# Patient Record
Sex: Female | Born: 1980 | Race: Black or African American | Hispanic: No | Marital: Married | Smoking: Current some day smoker
Health system: Southern US, Community
[De-identification: ages and names within clinical notes are randomized; demographics above are authoritative.]

## PROBLEM LIST (undated history)

## (undated) DIAGNOSIS — I10 Essential (primary) hypertension: Secondary | ICD-10-CM

---

## 2012-11-03 ENCOUNTER — Emergency Department (HOSPITAL_COMMUNITY): Admission: EM | Admit: 2012-11-03 | Discharge: 2012-11-03 | Discharge status: Against Medical Advice | Payer: Self-pay

## 2012-11-05 ENCOUNTER — Encounter (HOSPITAL_COMMUNITY): Payer: Self-pay | Admitting: Emergency Medicine

## 2012-11-05 ENCOUNTER — Emergency Department (HOSPITAL_COMMUNITY)
Admission: EM | Admit: 2012-11-05 | Discharge: 2012-11-05 | Disposition: A | Discharge status: Home / Self Care | Payer: 59 | Attending: Emergency Medicine | Admitting: Emergency Medicine

## 2012-11-05 DIAGNOSIS — R209 Unspecified disturbances of skin sensation: Secondary | ICD-10-CM | POA: Insufficient documentation

## 2012-11-05 DIAGNOSIS — G510 Bell's palsy: Secondary | ICD-10-CM

## 2012-11-05 MED ORDER — PREDNISONE 10 MG PO TABS: 40.0000 mg | ORAL_TABLET | Freq: Every day | ORAL | Status: DC

## 2012-11-05 MED ORDER — VALACYCLOVIR HCL 1 G PO TABS: 1000.0000 mg | ORAL_TABLET | Freq: Three times a day (TID) | ORAL | Status: AC

## 2012-11-05 NOTE — ED Provider Notes (Signed)
History     CSN: [id number]  Arrival date & time 11/05/12  1044   First MD Initiated Contact with Patient 11/05/12 1051      Chief Complaint  Patient presents with  . "thinks she has bell's palsy"     (Consider location/radiation/quality/duration/timing/severity/associated sxs/prior treatment) HPI Sheri Maldonado is a 32 y.o. female who presents to ED with complaint of left sided numbness. States started two days ago. States she is having problems closing left eye, left mouth drooping, states food tastes different. Denies any injury. Denies headache. Denies taking any medications for this. States no fever, chills, malaise. No recent tick bites. States thinks she has had similar symptoms when was pregnant 12 years ago, but states she is not currently pregnant.    History reviewed. No pertinent past medical history.  History reviewed. No pertinent past surgical history.  History reviewed. No pertinent family history.  History  Substance Use Topics  . Smoking status: Never Smoker   . Smokeless tobacco: Not on file  . Alcohol Use: No    OB History   Grav Para Term Preterm Abortions TAB SAB Ect Mult Living                  Review of Systems  HENT: Negative for ear pain, facial swelling, neck pain, neck stiffness and tinnitus.   Eyes: Negative for pain and visual disturbance.  Neurological: Positive for facial asymmetry, weakness and numbness. Negative for headaches.  All other systems reviewed and are negative.    Allergies  Sulfa antibiotics  Home Medications  No current outpatient prescriptions on file.  LMP 10/29/2012  Physical Exam  Nursing note and vitals reviewed. Constitutional: She is oriented to person, place, and time. She appears well-developed and well-nourished. No distress.  HENT:  Head: Normocephalic.  Right Ear: External ear normal.  Left Ear: External ear normal.  Nose: Nose normal.  Mouth/Throat: Oropharynx is clear and moist.  Eyes:  Conjunctivae and EOM are normal. Pupils are equal, round, and reactive to light.  Neck: Neck supple.  Cardiovascular: Normal rate, regular rhythm and normal heart sounds.   Pulmonary/Chest: Effort normal and breath sounds normal. No respiratory distress. She has no wheezes. She has no rales.  Musculoskeletal: Normal range of motion.  Neurological: She is alert and oriented to person, place, and time.  Unable to raise left eyebrow, unable to close left eye tight, left mouth drooping. Tongue is symmetrical. Decreased sensation over left face. 5/5 and equal upper and lower extremity strength bilaterally. No pronator drift. Normal finger to nose. Visual fields intact. Gait is normal. Speech is normal.   Skin: Skin is warm and dry.    ED Course  Procedures (including critical care time)  Labs Reviewed - No data to display No results found.   1. Bell's palsy       MDM  PT with left facial weakness and decreased sensation, affecting forehead. She has no other neuro deficits. She has decreased taste. Pt has no risk factors for TIA or CVA. Suspect bell's palsy. Will start on prednisone and acyclovir. Follow up with pcp.           Lottie Mussel, PA-C 11/05/12 1524

## 2012-11-05 NOTE — ED Notes (Signed)
Pt unwilling to answer most questions during triage.  Pt very short with this Clinical research associate.  States "I think I have bell's palsy".  When asked why she thought that, pt states "because of the symptoms I am having!".  Pt's face is symmetrical with no droop.

## 2012-11-05 NOTE — ED Provider Notes (Signed)
Medical screening examination/treatment/procedure(s) were performed by non-physician practitioner and as supervising physician I was immediately available for consultation/collaboration.  Lyanne Co, MD 11/05/12 [phone]

## 2015-08-28 ENCOUNTER — Encounter (HOSPITAL_COMMUNITY): Payer: Self-pay

## 2015-08-28 ENCOUNTER — Emergency Department (HOSPITAL_COMMUNITY)
Admission: EM | Admit: 2015-08-28 | Discharge: 2015-08-28 | Disposition: A | Discharge status: Home / Self Care | Payer: BLUE CROSS/BLUE SHIELD | Attending: Emergency Medicine | Admitting: Emergency Medicine

## 2015-08-28 DIAGNOSIS — Z7952 Long term (current) use of systemic steroids: Secondary | ICD-10-CM | POA: Diagnosis not present

## 2015-08-28 DIAGNOSIS — I10 Essential (primary) hypertension: Secondary | ICD-10-CM | POA: Diagnosis present

## 2015-08-28 LAB — I-STAT CHEM 8, ED
BUN: 7 mg/dL (ref 6–20)
CALCIUM ION: 1.02 mmol/L — AB (ref 1.12–1.23)
CHLORIDE: 102 mmol/L (ref 101–111)
Creatinine, Ser: 0.6 mg/dL (ref 0.44–1.00)
Glucose, Bld: 115 mg/dL — ABNORMAL HIGH (ref 65–99)
HEMATOCRIT: 46 % (ref 36.0–46.0)
Hemoglobin: 15.6 g/dL — ABNORMAL HIGH (ref 12.0–15.0)
Potassium: 3.5 mmol/L (ref 3.5–5.1)
SODIUM: 142 mmol/L (ref 135–145)
TCO2: 26 mmol/L (ref 0–100)

## 2015-08-28 MED ORDER — HYDROCHLOROTHIAZIDE 25 MG PO TABS: 25.0000 mg | ORAL_TABLET | Freq: Every day | ORAL | Status: DC

## 2015-08-28 NOTE — ED Notes (Signed)
Declined W/C at D/C and was escorted to lobby by RN. 

## 2015-08-28 NOTE — ED Notes (Signed)
[facility] and it was elevated. 184/125.  Pt. Got scared and came to us  [facility] She denies any sob chest pain, blurred vision or headache.  She is scared and [facility]. Alert and oriented X4 no neuro deficiits noted

## 2015-08-28 NOTE — ED Provider Notes (Signed)
CSN: [id number]     Arrival date & time 08/28/15  1405 History  By signing my name below, I, Sheri Maldonado, attest that this documentation has been prepared under the direction and in the presence of [name] PA-C. Electronically Signed: Bethel BornBritney Maldonado, ED Scribe. 08/28/2015 4:14 PM Chief Complaint  Patient presents with  . Hypertension   The history is provided by the patient. No language interpreter was used.   Sheri Maldonado is a 35 y.o. female who presents to the Emergency Department complaining of hypertension. Pt states that she has been monitoring her blood pressure at the pharmacy and today it was 184/125. The pharmacist referred her to the ED. She is unsure how long her blood pressure has been elevated. She denies any symptomatic concerns at this time. Pt denies headache, dizziness, lightheadedness, recent syncope, blurred vision, muscle weakness of the face or extremities, numbness, tingling, chest pain, SOB or abdominal pain. She [place] in [place] part-time and does not have a local PCP.   Chart Review: Pt seen in ED in 2014 with blood pressure of 149/107  History reviewed. No pertinent past medical history. History reviewed. No pertinent past surgical history. No family history on file. Social History  Substance Use Topics  . Smoking status: Never Smoker   . Smokeless tobacco: None  . Alcohol Use: Yes     Comment: OCCASSIONAL   OB History    No data available     Review of Systems  Constitutional: Negative for fever.  Eyes: Negative for visual disturbance.  Respiratory: Negative for shortness of breath.   Cardiovascular: Negative for chest pain.  Musculoskeletal: Negative for myalgias.  Neurological: Negative for dizziness, syncope, weakness, light-headedness, numbness and headaches.  All other systems reviewed and are negative.   Allergies  Sulfa antibiotics  Home Medications   Prior to Admission medications   Medication Sig Start Date End Date  Taking? Authorizing Provider  hydrochlorothiazide (HYDRODIURIL) 25 MG tablet Take 1 tablet (25 mg total) by mouth daily. 08/28/15   Sheri Mcphee, PA-C  predniSONE (DELTASONE) 10 MG tablet Take 4 tablets (40 mg total) by mouth daily. 11/05/12   Sheri Kirichenko, PA-C   BP 154/105 mmHg  Pulse 108  Temp(Src) 98.2 F (36.8 C) (Oral)  Resp 20  Ht 5\' 3"  (1.6 m)  Wt 202 lb 8 oz (91.853 kg)  BMI 35.88 kg/m2  SpO2 97%  LMP 08/22/2015 Physical Exam  Constitutional: She is oriented to person, place, and time. She appears well-developed and well-nourished. No distress.  HENT:  Head: Normocephalic and atraumatic.  Right Ear: External ear normal.  Left Ear: External ear normal.  Mouth/Throat: Oropharynx is clear and moist.  Eyes: Conjunctivae and EOM are normal. Pupils are equal, round, and reactive to light. Right eye exhibits no discharge. Left eye exhibits no discharge. No scleral icterus.  Neck: Normal range of motion.  Cardiovascular: Normal rate, regular rhythm and normal heart sounds.   Pulmonary/Chest: Effort normal and breath sounds normal. No respiratory distress.  Musculoskeletal: Normal range of motion.  Moves all extremities spontaneously  Neurological: She is alert and oriented to person, place, and time. No cranial nerve deficit. Coordination normal.  Alert and oriented x 4. Cranial nerves grossly intact. 5/5 grip strength bilaterally. Sensation to light touch intact over the extremities. Walks with a steady and coordinated gait.   Skin: Skin is warm and dry.  Psychiatric: She has a normal mood and affect. Her behavior is normal.  Nursing note and vitals reviewed.  ED Course  Procedures (including critical care time) DIAGNOSTIC STUDIES: Oxygen Saturation is 97% on RA,  normal by my interpretation.    COORDINATION OF CARE: 4:10 PM Discussed treatment plan which includes lab work and discharge with an antihypertensive with pt at bedside and pt agreed to plan.  Labs  Review Labs Reviewed  I-STAT CHEM 8, ED - Abnormal; Notable for the following:    Glucose, Bld 115 (*)    Calcium, Ion 1.02 (*)    Hemoglobin 15.6 (*)    All other components within normal limits    Imaging Review No results found. I personally reviewed and evaluated these lab results as a part of my medical decision-making.    EKG Interpretation None      MDM   Final diagnoses:  Essential hypertension   25 female presenting with hypertension. Patient had elevated blood pressure reading of 184/125 and a [facility] today. She is asymptomatic at this time. Chart review shows she has had high blood pressure readings dating back to 2014. Hypertensive to 154/105 in triage. Nonfocal neuro exam. Chem-8 without signs of kidney injury or electrolyte imbalance. Counseled patient on importance of blood pressure control. Discussed that uncontrolled blood pressure could lead to kidney injury, stroke, cardiovascular disease or even death. Will start patient on hydrochlorothiazide today. Given 2 week supply. Instructed to follow-up with a PCP in one week for blood pressure check. Given information for community health and wellness and sickle cell center. Patient is to call whoever can get her in at an appropriate time. Patient states understanding and agrees with the plan. Return precautions given in discharge paperwork and discussed with pt at bedside. Pt stable for discharge  I personally performed the services described in this documentation, which was scribed in my presence. The recorded information has been reviewed and is accurate.    Rolm Gala Sheri Mcphearson, PA-C 08/28/15 1736  Derwood Kaplan, MD 08/29/15 1610

## 2015-08-28 NOTE — Discharge Instructions (Signed)
You will need to schedule a follow up appointment with a primary care provider for a recheck of your blood pressure in 1 week.    Hypertension Hypertension, commonly called high blood pressure, is when the force of blood pumping through your arteries is too strong. Your arteries are the blood vessels that carry blood from your heart throughout your body. A blood pressure reading consists of a higher number over a lower number, such as 110/72. The higher number (systolic) is the pressure inside your arteries when your heart pumps. The lower number (diastolic) is the pressure inside your arteries when your heart relaxes. Ideally you want your blood pressure below 120/80. Hypertension forces your heart to work harder to pump blood. Your arteries may become narrow or stiff. Having untreated or uncontrolled hypertension can cause heart attack, stroke, kidney disease, and other problems. RISK FACTORS Some risk factors for high blood pressure are controllable. Others are not.  Risk factors you cannot control include:   Race. You may be at higher risk if you are African American.  Age. Risk increases with age.  Gender. Men are at higher risk than women before age 34 years. After age 34, women are at higher risk than men. Risk factors you can control include:  Not getting enough exercise or physical activity.  Being overweight.  Getting too much fat, sugar, calories, or salt in your diet.  Drinking too much alcohol. SIGNS AND SYMPTOMS Hypertension does not usually cause signs or symptoms. Extremely high blood pressure (hypertensive crisis) may cause headache, anxiety, shortness of breath, and nosebleed. DIAGNOSIS To check if you have hypertension, your health care provider will measure your blood pressure while you are seated, with your arm held at the level of your heart. It should be measured at least twice using the same arm. Certain conditions can cause a difference in blood pressure between  your right and left arms. A blood pressure reading that is higher than normal on one occasion does not mean that you need treatment. If it is not clear whether you have high blood pressure, you may be asked to return on a different day to have your blood pressure checked again. Or, you may be asked to monitor your blood pressure at home for 1 or more weeks. TREATMENT Treating high blood pressure includes making lifestyle changes and possibly taking medicine. Living a healthy lifestyle can help lower high blood pressure. You may need to change some of your habits. Lifestyle changes may include:  Following the DASH diet. This diet is high in fruits, vegetables, and whole grains. It is low in salt, red meat, and added sugars.  Keep your sodium intake below 2,300 mg per day.  Getting at least 30-45 minutes of aerobic exercise at least 4 times per week.  Losing weight if necessary.  Not smoking.  Limiting alcoholic beverages.  Learning ways to reduce stress. Your health care provider may prescribe medicine if lifestyle changes are not enough to get your blood pressure under control, and if one of the following is true:  You are 7518-34 years of age and your systolic blood pressure is above 140.  You are 34 years of age or older, and your systolic blood pressure is above 150.  Your diastolic blood pressure is above 90.  You have diabetes, and your systolic blood pressure is over 140 or your diastolic blood pressure is over 90.  You have kidney disease and your blood pressure is above 140/90.  You have heart disease  and your blood pressure is above 140/90. Your personal target blood pressure may vary depending on your medical conditions, your age, and other factors. HOME CARE INSTRUCTIONS  Have your blood pressure rechecked as directed by your health care provider.   Take medicines only as directed by your health care provider. Follow the directions carefully. Blood pressure medicines  must be taken as prescribed. The medicine does not work as well when you skip doses. Skipping doses also puts you at risk for problems.  Do not smoke.   Monitor your blood pressure at home as directed by your health care provider. SEEK MEDICAL CARE IF:   You think you are having a reaction to medicines taken.  You have recurrent headaches or feel dizzy.  You have swelling in your ankles.  You have trouble with your vision. SEEK IMMEDIATE MEDICAL CARE IF:  You develop a severe headache or confusion.  You have unusual weakness, numbness, or feel faint.  You have severe chest or abdominal pain.  You vomit repeatedly.  You have trouble breathing. MAKE SURE YOU:   Understand these instructions.  Will watch your condition.  Will get help right away if you are not doing well or get worse.   This information is not intended to replace advice given to you by your health care provider. Make sure you discuss any questions you have with your health care provider.   Document Released: 06/05/2005 Document Revised: 10/20/2014 Document Reviewed: 03/28/2013 Elsevier Interactive Patient Education [employer]

## 2015-09-18 ENCOUNTER — Emergency Department (HOSPITAL_COMMUNITY)
Admission: EM | Admit: 2015-09-18 | Discharge: 2015-09-18 | Disposition: A | Discharge status: Home / Self Care | Payer: BLUE CROSS/BLUE SHIELD | Attending: Emergency Medicine | Admitting: Emergency Medicine

## 2015-09-18 ENCOUNTER — Emergency Department (HOSPITAL_COMMUNITY): Payer: BLUE CROSS/BLUE SHIELD

## 2015-09-18 ENCOUNTER — Encounter (HOSPITAL_COMMUNITY): Payer: Self-pay | Admitting: Physical Medicine and Rehabilitation

## 2015-09-18 DIAGNOSIS — I10 Essential (primary) hypertension: Secondary | ICD-10-CM | POA: Diagnosis not present

## 2015-09-18 DIAGNOSIS — Z7952 Long term (current) use of systemic steroids: Secondary | ICD-10-CM | POA: Diagnosis not present

## 2015-09-18 DIAGNOSIS — R079 Chest pain, unspecified: Secondary | ICD-10-CM

## 2015-09-18 DIAGNOSIS — E663 Overweight: Secondary | ICD-10-CM | POA: Insufficient documentation

## 2015-09-18 DIAGNOSIS — Z79899 Other long term (current) drug therapy: Secondary | ICD-10-CM | POA: Insufficient documentation

## 2015-09-18 DIAGNOSIS — J069 Acute upper respiratory infection, unspecified: Secondary | ICD-10-CM | POA: Insufficient documentation

## 2015-09-18 HISTORY — DX: Essential (primary) hypertension: I10

## 2015-09-18 LAB — COMPREHENSIVE METABOLIC PANEL
ALT: 25 U/L (ref 14–54)
AST: 29 U/L (ref 15–41)
Albumin: 3.9 g/dL (ref 3.5–5.0)
Alkaline Phosphatase: 71 U/L (ref 38–126)
Anion gap: 15 (ref 5–15)
BUN: <5 mg/dL — ABNORMAL LOW (ref 6–20)
CHLORIDE: 102 mmol/L (ref 101–111)
CO2: 21 mmol/L — ABNORMAL LOW (ref 22–32)
CREATININE: 0.67 mg/dL (ref 0.44–1.00)
Calcium: 9.5 mg/dL (ref 8.9–10.3)
Glucose, Bld: 119 mg/dL — ABNORMAL HIGH (ref 65–99)
POTASSIUM: 3.3 mmol/L — AB (ref 3.5–5.1)
Sodium: 138 mmol/L (ref 135–145)
Total Bilirubin: 0.5 mg/dL (ref 0.3–1.2)
Total Protein: 8.2 g/dL — ABNORMAL HIGH (ref 6.5–8.1)

## 2015-09-18 LAB — I-STAT BETA HCG BLOOD, ED (MC, WL, AP ONLY): I-stat hCG, quantitative: <5 m[IU]/mL (ref <5)

## 2015-09-18 LAB — CBC WITH DIFFERENTIAL/PLATELET
Basophils Absolute: 0.1 10*3/uL (ref 0.0–0.1)
Basophils Relative: 1 %
EOS PCT: 5 %
Eosinophils Absolute: 0.4 10*3/uL (ref 0.0–0.7)
HCT: 42 % (ref 36.0–46.0)
Hemoglobin: 14.3 g/dL (ref 12.0–15.0)
LYMPHS ABS: 2.8 10*3/uL (ref 0.7–4.0)
LYMPHS PCT: 36 %
MCH: 26.9 pg (ref 26.0–34.0)
MCHC: 34 g/dL (ref 30.0–36.0)
MCV: 79.1 fL (ref 78.0–100.0)
MONO ABS: 0.5 10*3/uL (ref 0.1–1.0)
Monocytes Relative: 7 %
Neutro Abs: 4 10*3/uL (ref 1.7–7.7)
Neutrophils Relative %: 51 %
PLATELETS: 338 10*3/uL (ref 150–400)
RBC: 5.31 MIL/uL — ABNORMAL HIGH (ref 3.87–5.11)
RDW: 14.1 % (ref 11.5–15.5)
WBC: 7.7 10*3/uL (ref 4.0–10.5)

## 2015-09-18 LAB — I-STAT TROPONIN, ED: TROPONIN I, POC: 0 ng/mL (ref 0.00–0.08)

## 2015-09-18 LAB — D-DIMER, QUANTITATIVE: D-Dimer, Quant: 0.27 ug/mL-FEU (ref 0.00–0.50)

## 2015-09-18 NOTE — ED Notes (Signed)
Pt reports cough, sinus congestion, chest pressure and dizziness. Ongoing x1 week.

## 2015-09-18 NOTE — ED Notes (Signed)
Pt ambulates independently and with steady gait at time of discharge. Discharge instructions and follow up information reviewed with patient. No other questions or concerns voiced at this time.  

## 2015-09-18 NOTE — ED Provider Notes (Signed)
CSN: [id number]     Arrival date & time 09/18/15  [id number] History   First MD Initiated Contact with Patient 09/18/15 [phone]     Chief Complaint  Patient presents with  . Chest Pain     Patient is a 35 y.o. female presenting with chest pain. The history is provided by the patient.  Chest Pain Associated symptoms: no abdominal pain, no back pain, no headache, no nausea, no numbness, no shortness of breath, not vomiting and no weakness   Patient presents with chest pain. It's intermittent chest. She states she's had a cold for the last week or 2. She's had a little cough with minimal production. States it was initially congestion or have been removed on her chest. She's taken some gauze for cough medicine for her. States today she has more pain in her mid chest. It is dull. Is constant. Not worse with exertion. States she also felt jittery. No swelling or legs. She occasionally smokes but states she just does it when she drinks. Does have a history of early cardiac disease in her family.  Past Medical History  Diagnosis Date  . Hypertension    History reviewed. No pertinent past surgical history. No family history on file. Social History  Substance Use Topics  . Smoking status: Never Smoker   . Smokeless tobacco: None  . Alcohol Use: Yes     Comment: OCCASSIONAL   OB History    No data available     Review of Systems  Constitutional: Negative for activity change and appetite change.  Eyes: Negative for pain.  Respiratory: Positive for wheezing. Negative for chest tightness and shortness of breath.        Patient states she'll sometimes feel a wheeze when she lays down.  Cardiovascular: Positive for chest pain. Negative for leg swelling.  Gastrointestinal: Negative for nausea, vomiting, abdominal pain and diarrhea.  Genitourinary: Negative for flank pain.  Musculoskeletal: Negative for back pain and neck stiffness.  Skin: Negative for rash.  Neurological: Negative for weakness, numbness  and headaches.  Psychiatric/Behavioral: Negative for behavioral problems.      Allergies  Sulfa antibiotics  Home Medications   Prior to Admission medications   Medication Sig Start Date End Date Taking? Authorizing Provider  hydrochlorothiazide (HYDRODIURIL) 25 MG tablet Take 1 tablet (25 mg total) by mouth daily. 08/28/15   Stevi Barrett, PA-C  predniSONE (DELTASONE) 10 MG tablet Take 4 tablets (40 mg total) by mouth daily. 11/05/12   Tatyana Kirichenko, PA-C   BP 142/113 mmHg  Pulse 100  Temp(Src) 98.1 F (36.7 C) (Oral)  Resp 16  Ht 5\' 3"  (1.6 m)  Wt 202 lb (91.627 kg)  BMI 35.79 kg/m2  SpO2 100%  LMP 08/22/2015 Physical Exam  Constitutional: She is oriented to person, place, and time. She appears well-developed.  Patient is overweight  HENT:  Head: Normocephalic and atraumatic.  Eyes: Pupils are equal, round, and reactive to light.  Neck: Normal range of motion. Neck supple.  Cardiovascular: Normal rate, regular rhythm and normal heart sounds.   No murmur heard. Pulmonary/Chest: Effort normal. No respiratory distress. She exhibits tenderness.  Tenderness to mid anterior chest wall. No crepitus or deformity.  Abdominal: Soft. She exhibits no distension. There is no tenderness.  Musculoskeletal: Normal range of motion. She exhibits no edema.  Neurological: She is alert and oriented to person, place, and time. No cranial nerve deficit.  Skin: Skin is warm.  Psychiatric: Her speech is normal.  Nursing note and  vitals reviewed.   ED Course  Procedures (including critical care time) Labs Review Labs Reviewed  CBC WITH DIFFERENTIAL/PLATELET - Abnormal; Notable for the following:    RBC 5.31 (*)    All other components within normal limits  COMPREHENSIVE METABOLIC PANEL - Abnormal; Notable for the following:    Potassium 3.3 (*)    CO2 21 (*)    Glucose, Bld 119 (*)    BUN <5 (*)    Total Protein 8.2 (*)    All other components within normal limits  D-DIMER,  QUANTITATIVE (NOT AT [facility])  I-STAT TROPOININ, ED  I-STAT BETA HCG BLOOD, ED ([facility], [facility], [facility] ONLY)    Imaging Review Dg Chest 2 View  09/18/2015  CLINICAL DATA:  Cough, chest congestion. EXAM: CHEST  2 VIEW COMPARISON:  None. FINDINGS: The heart size and mediastinal contours are within normal limits. Both lungs are clear. No pneumothorax or pleural effusion is noted. The visualized skeletal structures are unremarkable. IMPRESSION: No active cardiopulmonary disease. Electronically Signed   By: Lupita Raider, M.D.   On: 09/18/2015 09:35   I have personally reviewed and evaluated these images and lab results as part of my medical decision-making.   EKG Interpretation   Date/Time:  Saturday September 18 2015 08:58:37 EDT Ventricular Rate:  115 PR Interval:  136 QRS Duration: 74 QT Interval:  310 QTC Calculation: 428 R Axis:   118 Text Interpretation:  Sinus tachycardia Right axis deviation Septal  infarct , age undetermined Abnormal ECG Confirmed by Harshita Bernales  MD, Nehal Shives  [phone]) on 09/18/2015 9:25:09 AM      MDM   Final diagnoses:  URI (upper respiratory infection)  Chest pain, unspecified chest pain type    Chest pain. URI symptoms also. Well-appearing. EKG and lab work reassuring. Discharge home. Also had negative d-dimer.    Benjiman Core, MD 09/18/15 [phone]

## 2015-09-18 NOTE — Discharge Instructions (Signed)
Nonspecific Chest Pain °It is often hard to find the cause of chest pain. There is always a chance that your pain could be related to something serious, such as a heart attack or a blood clot in your lungs. Chest pain can also be caused by conditions that are not life-threatening. If you have chest pain, it is very important to follow up with your doctor. ° °HOME CARE °· If you were prescribed an antibiotic medicine, finish it all even if you start to feel better. °· Avoid any activities that cause chest pain. °· Do not use any tobacco products, including cigarettes, chewing tobacco, or electronic cigarettes. If you need help quitting, ask your doctor. °· Do not drink alcohol. °· Take medicines only as told by your doctor. °· Keep all follow-up visits as told by your doctor. This is important. This includes any further testing if your chest pain does not go away. °· Your doctor may tell you to keep your head raised (elevated) while you sleep. °· Make lifestyle changes as told by your doctor. These may include: °· Getting regular exercise. Ask your doctor to suggest some activities that are safe for you. °· Eating a heart-healthy diet. Your doctor or a diet specialist (dietitian) can help you to learn healthy eating options. °· Maintaining a healthy weight. °· Managing diabetes, if necessary. °· Reducing stress. °GET HELP IF: °· Your chest pain does not go away, even after treatment. °· You have a rash with blisters on your chest. °· You have a fever. °GET HELP RIGHT AWAY IF: °· Your chest pain is worse. °· You have an increasing cough, or you cough up blood. °· You have severe belly (abdominal) pain. °· You feel extremely weak. °· You pass out (faint). °· You have chills. °· You have sudden, unexplained chest discomfort. °· You have sudden, unexplained discomfort in your arms, back, neck, or jaw. °· You have shortness of breath at any time. °· You suddenly start to sweat, or your skin gets clammy. °· You feel  nauseous. °· You vomit. °· You suddenly feel light-headed or dizzy. °· Your heart begins to beat quickly, or it feels like it is skipping beats. °These symptoms may be an emergency. Do not wait to see if the symptoms will go away. Get medical help right away. Call your local emergency services (911 in the [place] Do not drive yourself to the hospital. °  °This information is not intended to replace advice given to you by your health care provider. Make sure you discuss any questions you have with your health care provider. °  °Document Released: 11/22/2007 Document Revised: 06/26/2014 Document Reviewed: 01/09/2014 °Elsevier Interactive Patient Education ©2016 Elsevier Inc[employer] ° °Upper Respiratory Infection, Adult °Most upper respiratory infections (URIs) are a viral infection of the air passages leading to the lungs. A URI affects the nose, throat, and upper air passages. The most common type of URI is nasopharyngitis and is typically referred to as "the common cold." °URIs run their course and usually go away on their own. Most of the time, a URI does not require medical attention, but sometimes a bacterial infection in the upper airways can follow a viral infection. This is called a secondary infection. Sinus and middle ear infections are common types of secondary upper respiratory infections. °Bacterial pneumonia can also complicate a URI. A URI can worsen asthma and chronic obstructive pulmonary disease (COPD). Sometimes, these complications can require emergency medical care and may be life threatening.  °CAUSES °  Almost all URIs are caused by viruses. A virus is a type of germ and can spread from one person to another.  °RISKS FACTORS °You may be at risk for a URI if:  °· You smoke.   °· You have chronic heart or lung disease. °· You have a weakened defense (immune) system.   °· You are very young or very old.   °· You have nasal allergies or asthma. °· You work in crowded or poorly ventilated areas. °· You work in  health care facilities or schools. °SIGNS AND SYMPTOMS  °Symptoms typically develop 2-3 days after you come in contact with a cold virus. Most viral URIs last 7-10 days. However, viral URIs from the influenza virus (flu virus) can last 14-18 days and are typically more severe. Symptoms may include:  °· Runny or stuffy (congested) nose.   °· Sneezing.   °· Cough.   °· Sore throat.   °· Headache.   °· Fatigue.   °· Fever.   °· Loss of appetite.   °· Pain in your forehead, behind your eyes, and over your cheekbones (sinus pain). °· Muscle aches.   °DIAGNOSIS  °Your health care provider may diagnose a URI by: °· Physical exam. °· Tests to check that your symptoms are not due to another condition such as: °¨ Strep throat. °¨ Sinusitis. °¨ Pneumonia. °¨ Asthma. °TREATMENT  °A URI goes away on its own with time. It cannot be cured with medicines, but medicines may be prescribed or recommended to relieve symptoms. Medicines may help: °· Reduce your fever. °· Reduce your cough. °· Relieve nasal congestion. °HOME CARE INSTRUCTIONS  °· Take medicines only as directed by your health care provider.   °· Gargle warm saltwater or take cough drops to comfort your throat as directed by your health care provider. °· Use a warm mist humidifier or inhale steam from a shower to increase air moisture. This may make it easier to breathe. °· Drink enough fluid to keep your urine clear or pale yellow.   °· Eat soups and other clear broths and maintain good nutrition.   °· Rest as needed.   °· Return to work when your temperature has returned to normal or as your health care provider advises. You may need to stay home longer to avoid infecting others. You can also use a face mask and careful hand washing to prevent spread of the virus. °· Increase the usage of your inhaler if you have asthma.   °· Do not use any tobacco products, including cigarettes, chewing tobacco, or electronic cigarettes. If you need help quitting, ask your health care  provider. °PREVENTION  °The best way to protect yourself from getting a cold is to practice good hygiene.  °· Avoid oral or hand contact with people with cold symptoms.   °· Wash your hands often if contact occurs.   °There is no clear evidence that vitamin C, vitamin E, echinacea, or exercise reduces the chance of developing a cold. However, it is always recommended to get plenty of rest, exercise, and practice good nutrition.  °SEEK MEDICAL CARE IF:  °· You are getting worse rather than better.   °· Your symptoms are not controlled by medicine.   °· You have chills. °· You have worsening shortness of breath. °· You have brown or red mucus. °· You have yellow or brown nasal discharge. °· You have pain in your face, especially when you bend forward. °· You have a fever. °· You have swollen neck glands. °· You have pain while swallowing. °· You have white areas in the back of your   throat. °SEEK IMMEDIATE MEDICAL CARE IF:  °· You have severe or persistent: °¨ Headache. °¨ Ear pain. °¨ Sinus pain. °¨ Chest pain. °· You have chronic lung disease and any of the following: °¨ Wheezing. °¨ Prolonged cough. °¨ Coughing up blood. °¨ A change in your usual mucus. °· You have a stiff neck. °· You have changes in your: °¨ Vision. °¨ Hearing. °¨ Thinking. °¨ Mood. °MAKE SURE YOU:  °· Understand these instructions. °· Will watch your condition. °· Will get help right away if you are not doing well or get worse. °  °This information is not intended to replace advice given to you by your health care provider. Make sure you discuss any questions you have with your health care provider. °  °Document Released: 11/29/2000 Document Revised: 10/20/2014 Document Reviewed: 09/10/2013 °Elsevier Interactive Patient Education ©2016 Elsevier Inc. ° °

## 2016-01-20 ENCOUNTER — Emergency Department (HOSPITAL_COMMUNITY)
Admission: EM | Admit: 2016-01-20 | Discharge: 2016-01-20 | Discharge status: Against Medical Advice | Payer: BLUE CROSS/BLUE SHIELD

## 2016-01-20 NOTE — ED Notes (Signed)
Pt states that she does not want to wait 5 hrs to be seen; pt was seen at [facility] and was dc'd; pt states "I don't want to go home and die and my blood pressure was elevated"; pt states "I am going to go home and come back if I feel worse

## 2016-01-21 ENCOUNTER — Emergency Department (HOSPITAL_COMMUNITY)
Admission: EM | Admit: 2016-01-21 | Discharge: 2016-01-21 | Disposition: A | Discharge status: Home / Self Care | Payer: BLUE CROSS/BLUE SHIELD | Attending: Emergency Medicine | Admitting: Emergency Medicine

## 2016-01-21 ENCOUNTER — Encounter (HOSPITAL_COMMUNITY): Payer: Self-pay | Admitting: *Deleted

## 2016-01-21 ENCOUNTER — Emergency Department (HOSPITAL_COMMUNITY): Payer: BLUE CROSS/BLUE SHIELD

## 2016-01-21 DIAGNOSIS — F17210 Nicotine dependence, cigarettes, uncomplicated: Secondary | ICD-10-CM | POA: Insufficient documentation

## 2016-01-21 DIAGNOSIS — R51 Headache: Secondary | ICD-10-CM | POA: Insufficient documentation

## 2016-01-21 DIAGNOSIS — J0110 Acute frontal sinusitis, unspecified: Secondary | ICD-10-CM | POA: Diagnosis not present

## 2016-01-21 DIAGNOSIS — R519 Headache, unspecified: Secondary | ICD-10-CM

## 2016-01-21 DIAGNOSIS — I10 Essential (primary) hypertension: Secondary | ICD-10-CM | POA: Insufficient documentation

## 2016-01-21 LAB — BASIC METABOLIC PANEL
Anion gap: 9 (ref 5–15)
BUN: 7 mg/dL (ref 6–20)
CO2: 22 mmol/L (ref 22–32)
CREATININE: 0.81 mg/dL (ref 0.44–1.00)
Calcium: 8.5 mg/dL — ABNORMAL LOW (ref 8.9–10.3)
Chloride: 106 mmol/L (ref 101–111)
GFR calc Af Amer: >60 mL/min (ref >60)
GFR calc non Af Amer: >60 mL/min (ref >60)
Glucose, Bld: 99 mg/dL (ref 65–99)
POTASSIUM: 3.4 mmol/L — AB (ref 3.5–5.1)
SODIUM: 137 mmol/L (ref 135–145)

## 2016-01-21 LAB — CBC
HEMATOCRIT: 37.9 % (ref 36.0–46.0)
Hemoglobin: 12.5 g/dL (ref 12.0–15.0)
MCH: 27.1 pg (ref 26.0–34.0)
MCHC: 33 g/dL (ref 30.0–36.0)
MCV: 82.2 fL (ref 78.0–100.0)
PLATELETS: 327 10*3/uL (ref 150–400)
RBC: 4.61 MIL/uL (ref 3.87–5.11)
RDW: 13.9 % (ref 11.5–15.5)
WBC: 8.6 10*3/uL (ref 4.0–10.5)

## 2016-01-21 LAB — URINALYSIS, ROUTINE W REFLEX MICROSCOPIC
Bilirubin Urine: NEGATIVE
GLUCOSE, UA: NEGATIVE mg/dL
Ketones, ur: NEGATIVE mg/dL
Nitrite: NEGATIVE
PH: 6 (ref 5.0–8.0)
Protein, ur: 30 mg/dL — AB
Specific Gravity, Urine: 1.015 (ref 1.005–1.030)

## 2016-01-21 LAB — URINE MICROSCOPIC-ADD ON

## 2016-01-21 MED ORDER — KETOROLAC TROMETHAMINE 15 MG/ML IJ SOLN
15.0000 mg | Freq: Once | INTRAMUSCULAR | Status: AC
  Administered 2016-01-21: 15 mg via INTRAVENOUS
  Filled 2016-01-21: qty 1

## 2016-01-21 MED ORDER — DIPHENHYDRAMINE HCL 50 MG/ML IJ SOLN
25.0000 mg | Freq: Once | INTRAMUSCULAR | Status: AC
  Administered 2016-01-21: 25 mg via INTRAVENOUS
  Filled 2016-01-21: qty 1

## 2016-01-21 MED ORDER — SODIUM CHLORIDE 0.9 % IV BOLUS (SEPSIS)
1000.0000 mL | Freq: Once | INTRAVENOUS | Status: AC
  Administered 2016-01-21: 1000 mL via INTRAVENOUS

## 2016-01-21 MED ORDER — PROCHLORPERAZINE EDISYLATE 5 MG/ML IJ SOLN
10.0000 mg | Freq: Once | INTRAMUSCULAR | Status: AC
  Administered 2016-01-21: 10 mg via INTRAVENOUS
  Filled 2016-01-21: qty 2

## 2016-01-21 MED ORDER — AMOXICILLIN-POT CLAVULANATE 875-125 MG PO TABS: 1.0000 | ORAL_TABLET | Freq: Two times a day (BID) | ORAL | 0 refills | Status: DC

## 2016-01-21 MED ORDER — SALINE SPRAY 0.65 % NA SOLN: 1.0000 | NASAL | 0 refills | Status: DC | PRN

## 2016-01-21 MED ORDER — POTASSIUM CHLORIDE CRYS ER 20 MEQ PO TBCR
40.0000 meq | EXTENDED_RELEASE_TABLET | Freq: Once | ORAL | Status: AC
  Administered 2016-01-21: 40 meq via ORAL
  Filled 2016-01-21: qty 2

## 2016-01-21 MED ORDER — NAPROXEN 375 MG PO TABS: 375.0000 mg | ORAL_TABLET | Freq: Two times a day (BID) | ORAL | 0 refills | Status: DC | PRN

## 2016-01-21 NOTE — ED Provider Notes (Signed)
[facility]-EMERGENCY DEPT Provider Note   CSN: [id number] Arrival date & time: 01/21/16  4098  First Provider Contact:  First MD Initiated Contact with Patient 01/21/16 [phone]     History   Chief Complaint Chief Complaint  Patient presents with  . Migraine    HPI Sheri Maldonado is a 35 y.o. female.  HPI 35 year old female with past medical history of hypertension who presents with headache. The patient states that over the last 2-3 days, she's had progressively worsening frontal and now and like headache across her head. Her symptoms started gradually with the sensation of pressure and the front of her head. This has since progressed to generalized headache that she describes as aching, throbbing and 10 out of 10 in severity. She had some mild lightheadedness with worsening of her pain and states she may have passed out at work several days ago. Denies any current chest pain or syncope. No personal family history of syncope. Denies any numbness or weakness. No fevers or chills. No blood thinner use.  Past Medical History:  Diagnosis Date  . Hypertension     There are no active problems to display for this patient.   History reviewed. No pertinent surgical history.  OB History    No data available       Home Medications    Prior to Admission medications   Medication Sig Start Date End Date Taking? Authorizing Provider  amoxicillin-clavulanate (AUGMENTIN) 875-125 MG tablet Take 1 tablet by mouth every 12 (twelve) hours. 01/21/16   Shaune Pollack, MD  hydrochlorothiazide (HYDRODIURIL) 25 MG tablet Take 1 tablet (25 mg total) by mouth daily. 08/28/15   Stevi Barrett, PA-C  naproxen (NAPROSYN) 375 MG tablet Take 1 tablet (375 mg total) by mouth 2 (two) times daily as needed. 01/21/16   Shaune Pollack, MD  predniSONE (DELTASONE) 10 MG tablet Take 4 tablets (40 mg total) by mouth daily. 11/05/12   Tatyana Kirichenko, PA-C  sodium chloride (OCEAN) 0.65 % SOLN nasal spray Place 1 spray  into both nostrils as needed for congestion. 01/21/16   Shaune Pollack, MD    Family History No family history on file.  Social History Social History  Substance Use Topics  . Smoking status: Current Some Day Smoker    Types: Cigarettes, Cigars  . Smokeless tobacco: Never Used  . Alcohol use Yes     Comment: OCCASSIONAL     Allergies   Sulfa antibiotics   Review of Systems Review of Systems  Constitutional: Positive for fatigue. Negative for chills and fever.  HENT: Positive for sinus pressure. Negative for congestion and rhinorrhea.   Eyes: Negative for visual disturbance.  Respiratory: Negative for cough, shortness of breath and wheezing.   Cardiovascular: Negative for chest pain and leg swelling.  Gastrointestinal: Negative for abdominal pain, diarrhea, nausea and vomiting.  Genitourinary: Negative for dysuria and flank pain.  Musculoskeletal: Negative for neck pain and neck stiffness.  Skin: Negative for rash and wound.  Allergic/Immunologic: Negative for immunocompromised state.  Neurological: Positive for light-headedness and headaches. Negative for syncope and weakness.     Physical Exam Updated Vital Signs BP 121/79 (BP Location: Right Arm)   Pulse 86   Temp 98.1 F (36.7 C) (Oral)   Resp 16   Wt 203 lb 9.6 oz (92.4 kg)   LMP 01/19/2016 (Approximate)   SpO2 99%   BMI 36.07 kg/m   Physical Exam  Constitutional: She is oriented to person, place, and time. She appears well-developed and well-nourished. No distress.  HENT:  Head: Normocephalic and atraumatic.  Mouth/Throat: Oropharynx is clear and moist.  Moderate sinus tenderness to palpation across frontal and maxillary sinuses bilaterally. Nasal congestion and rhinorrhea.  Eyes: Conjunctivae are normal. Pupils are equal, round, and reactive to light.  Neck: Neck supple.  Cardiovascular: Normal rate, regular rhythm and normal heart sounds.  Exam reveals no friction rub.   No murmur  heard. Pulmonary/Chest: Effort normal and breath sounds normal. No respiratory distress. She has no wheezes. She has no rales.  Abdominal: She exhibits no distension. There is no tenderness.  Musculoskeletal: She exhibits no edema.  Neurological: She is alert and oriented to person, place, and time. She exhibits normal muscle tone.  Skin: Skin is warm. Capillary refill takes less than 2 seconds.  Nursing note and vitals reviewed.   Neurological Exam:  Mental Status: Alert and oriented to person, place, and time. Attention and concentration normal. Speech clear. Recent memory is intact. Cranial Nerves: Visual fields intact to confrontation in all quadrants bilaterally. EOMI and PERRLA. No nystagmus noted. Facial sensation intact at forehead, maxillary cheek, and chin/mandible bilaterally. No weakness of masticatory muscles. No facial asymmetry or weakness. Hearing grossly normal to finer rub. Uvula is midline, and palate elevates symmetrically. Normal SCM and trapezius strength. Tongue midline without fasciculations Motor: Muscle strength 5/5 in proximal and distal UE and LE bilaterally. No pronator drift. Muscle tone normal. Reflexes: 2+ and symmetrical in all four extremities.  Sensation: Intact to light touch in upper and lower extremities distally bilaterally.  Gait: Normal without ataxia. Coordination: Normal FTN bilaterally.     ED Treatments / Results  Labs (all labs ordered are listed, but only abnormal results are displayed) Labs Reviewed  BASIC METABOLIC PANEL - Abnormal; Notable for the following:       Result Value   Potassium 3.4 (*)    Calcium 8.5 (*)    All other components within normal limits  URINALYSIS, ROUTINE W REFLEX MICROSCOPIC (NOT AT [facility]) - Abnormal; Notable for the following:    APPearance CLOUDY (*)    Hgb urine dipstick LARGE (*)    Protein, ur 30 (*)    Leukocytes, UA TRACE (*)    All other components within normal limits  URINE MICROSCOPIC-ADD ON -  Abnormal; Notable for the following:    Squamous Epithelial / LPF 0-5 (*)    Bacteria, UA FEW (*)    All other components within normal limits  CBC    EKG  EKG Interpretation  Date/Time:  Friday January 21 2016 08:34:21 EDT Ventricular Rate:  94 PR Interval:    QRS Duration: 79 QT Interval:  366 QTC Calculation: 458 R Axis:   60 Text Interpretation:  Sinus rhythm Anteroseptal infarct, old Baseline wander in lead(s) II No significant change since last tracing EKG WITHIN NORMAL LIMITS Confirmed by Felita Bump MD, Sheria Lang [phone]) on 01/21/2016 8:37:17 AM       Radiology Ct Head Wo Contrast  Result Date: 01/21/2016 CLINICAL DATA:  Acute onset headache EXAM: CT HEAD WITHOUT CONTRAST TECHNIQUE: Contiguous axial images were obtained from the base of the skull through the vertex without intravenous contrast. COMPARISON:  None FINDINGS: Brain: The ventricles are normal in size and configuration. There is a prominent cavum vergae, an anatomic variant. There is no intracranial mass, hemorrhage, extra-axial fluid collection, or midline shift. Gray-white compartments are normal. No acute infarct evident. Vascular: No hyperdense vessel. There are no demonstrable vascular calcifications. Skull: The bony calvarium appears intact. Sinuses/Orbits: The orbits appear symmetric  bilaterally. There is opacification of multiple ethmoid air cells. There is mucosal thickening in both superior maxillary antra. Frontal sinuses are nearly aplastic. Other: Mastoid air cells are clear. IMPRESSION: Areas of paranasal sinus disease. No intracranial mass, hemorrhage, or focal gray - white compartment lesions/acute appearing infarct. Electronically Signed   By: Bretta Bang III M.D.   On: 01/21/2016 09:35    Procedures Procedures (including critical care time)  Medications Ordered in ED Medications  prochlorperazine (COMPAZINE) injection 10 mg (10 mg Intravenous Given 01/21/16 0834)  diphenhydrAMINE (BENADRYL) injection 25  mg (25 mg Intravenous Given 01/21/16 0834)  sodium chloride 0.9 % bolus 1,000 mL (0 mLs Intravenous Stopped 01/21/16 0934)  ketorolac (TORADOL) 15 MG/ML injection 15 mg (15 mg Intravenous Given 01/21/16 1010)  potassium chloride SA (K-DUR,KLOR-CON) CR tablet 40 mEq (40 mEq Oral Given 01/21/16 1027)     Initial Impression / Assessment and Plan / ED Course  I have reviewed the triage vital signs and the nursing notes.  Pertinent labs & imaging results that were available during my care of the patient were reviewed by me and considered in my medical decision making (see chart for details).  Clinical Course   35 year old female with no significant past medical history presents with headache, sinus congestion, and lightheadedness. See history of present illness above. On arrival, the patient is afebrile and well-appearing. Neurological exam is nonfocal. Regarding her headache, my primary suspicion is mild tension type headache versus sinusitis. She does endorse sinus pressure and congestion in the recent 2 weeks. Her mild lightheadedness/dizziness is consistent with this as well. CT scan obtained given no history of headaches and is consistent with sinusitis. She has no focal mass or lesions. No focal neurological deficits. She has no fever, neck stiffness or signs of meningitis or encephalitis. No signs or space-occupying lesion. Denies any recent tick bites. Symptoms are completely resolved with migraine cocktail and will treat symptomatically and with Augmentin. Otherwise, screening labwork is unremarkable. Regarding her lightheadedness. She has no signs of anemia or electrolyte abnormality. Screening EKG is unremarkable. Suspect this was secondary to sinus infection versus mild dehydration. Patient has been given fluids here and is well-appearing. Will discharge home  Final Clinical Impressions(s) / ED Diagnoses   Final diagnoses:  Bad headache  Acute frontal sinusitis, recurrence not specified    New  Prescriptions Discharge Medication List as of 01/21/2016 10:24 AM    START taking these medications   Details  amoxicillin-clavulanate (AUGMENTIN) 875-125 MG tablet Take 1 tablet by mouth every 12 (twelve) hours., Starting Fri 01/21/2016, Print    naproxen (NAPROSYN) 375 MG tablet Take 1 tablet (375 mg total) by mouth 2 (two) times daily as needed., Starting Fri 01/21/2016, Print    sodium chloride (OCEAN) 0.65 % SOLN nasal spray Place 1 spray into both nostrils as needed for congestion., Starting Fri 01/21/2016, Print         Shaune Pollack, MD 01/21/16 [phone]

## 2016-01-21 NOTE — ED Triage Notes (Addendum)
Pt c/o HA & dizziness onset x 5 days, pt c/o n/v, pt reports vomiting x 4 times over the last 24 hrs, pt c/o sensitivity to light, & blurred vision, pt ambulatory, pt went to [facility] last night & left after being triaged per pt, A&O x4

## 2016-01-21 NOTE — ED Notes (Signed)
IV d/c.. Not charted 

## 2016-01-21 NOTE — ED Notes (Signed)
Pt is in stable condition upon d/c and ambulates from ED. 

## 2016-01-21 NOTE — ED Notes (Signed)
Patient transported to CT 

## 2016-10-14 ENCOUNTER — Encounter (HOSPITAL_COMMUNITY)
Admission: EM | Disposition: A | Discharge status: Home / Self Care | Payer: Self-pay | Source: Home / Self Care | Attending: Obstetrics & Gynecology

## 2016-10-14 ENCOUNTER — Observation Stay (HOSPITAL_COMMUNITY)
Admission: EM | Admit: 2016-10-14 | Discharge: 2016-10-14 | Disposition: A | Discharge status: Home / Self Care | Payer: BLUE CROSS/BLUE SHIELD | Attending: Obstetrics and Gynecology | Admitting: Obstetrics and Gynecology

## 2016-10-14 ENCOUNTER — Inpatient Hospital Stay (HOSPITAL_COMMUNITY): Payer: BLUE CROSS/BLUE SHIELD | Admitting: Certified Registered Nurse Anesthetist

## 2016-10-14 ENCOUNTER — Encounter (HOSPITAL_COMMUNITY): Payer: Self-pay

## 2016-10-14 ENCOUNTER — Inpatient Hospital Stay (HOSPITAL_COMMUNITY): Payer: BLUE CROSS/BLUE SHIELD

## 2016-10-14 DIAGNOSIS — O0339 Incomplete spontaneous abortion with other complications: Secondary | ICD-10-CM | POA: Diagnosis not present

## 2016-10-14 DIAGNOSIS — Z3A17 17 weeks gestation of pregnancy: Secondary | ICD-10-CM | POA: Insufficient documentation

## 2016-10-14 DIAGNOSIS — O034 Incomplete spontaneous abortion without complication: Secondary | ICD-10-CM | POA: Diagnosis present

## 2016-10-14 DIAGNOSIS — O0289 Other abnormal products of conception: Secondary | ICD-10-CM

## 2016-10-14 DIAGNOSIS — O039 Complete or unspecified spontaneous abortion without complication: Secondary | ICD-10-CM

## 2016-10-14 HISTORY — PX: DILATION AND EVACUATION: SHX1459

## 2016-10-14 LAB — COMPREHENSIVE METABOLIC PANEL
ALK PHOS: 64 U/L (ref 38–126)
ALT: 11 U/L — ABNORMAL LOW (ref 14–54)
AST: 14 U/L — ABNORMAL LOW (ref 15–41)
Albumin: 3.5 g/dL (ref 3.5–5.0)
Anion gap: 11 (ref 5–15)
BILIRUBIN TOTAL: 0.1 mg/dL — AB (ref 0.3–1.2)
CALCIUM: 9.3 mg/dL (ref 8.9–10.3)
CO2: 21 mmol/L — ABNORMAL LOW (ref 22–32)
Chloride: 103 mmol/L (ref 101–111)
Creatinine, Ser: 0.68 mg/dL (ref 0.44–1.00)
GFR calc Af Amer: >60 mL/min (ref >60)
Glucose, Bld: 109 mg/dL — ABNORMAL HIGH (ref 65–99)
POTASSIUM: 3.1 mmol/L — AB (ref 3.5–5.1)
Sodium: 135 mmol/L (ref 135–145)
TOTAL PROTEIN: 7.3 g/dL (ref 6.5–8.1)

## 2016-10-14 LAB — LIPASE, BLOOD: Lipase: 23 U/L (ref 11–51)

## 2016-10-14 LAB — I-STAT BETA HCG BLOOD, ED (MC, WL, AP ONLY)

## 2016-10-14 LAB — URINALYSIS, ROUTINE W REFLEX MICROSCOPIC
BILIRUBIN URINE: NEGATIVE
Glucose, UA: NEGATIVE mg/dL
HGB URINE DIPSTICK: NEGATIVE
Ketones, ur: NEGATIVE mg/dL
Nitrite: NEGATIVE
Protein, ur: 30 mg/dL — AB
Specific Gravity, Urine: 1.019 (ref 1.005–1.030)
pH: 5 (ref 5.0–8.0)

## 2016-10-14 LAB — CBC
HEMATOCRIT: 34 % — AB (ref 36.0–46.0)
HEMOGLOBIN: 11.7 g/dL — AB (ref 12.0–15.0)
MCH: 26.9 pg (ref 26.0–34.0)
MCHC: 34.4 g/dL (ref 30.0–36.0)
MCV: 78.2 fL (ref 78.0–100.0)
Platelets: 314 10*3/uL (ref 150–400)
RBC: 4.35 MIL/uL (ref 3.87–5.11)
RDW: 12.7 % (ref 11.5–15.5)
WBC: 11.4 10*3/uL — ABNORMAL HIGH (ref 4.0–10.5)

## 2016-10-14 LAB — TYPE AND SCREEN
ABO/RH(D): A POS
Antibody Screen: POSITIVE
DAT, IgG: NEGATIVE

## 2016-10-14 SURGERY — DILATION AND EVACUATION, UTERUS
Anesthesia: General | Site: Vagina

## 2016-10-14 MED ORDER — MEPERIDINE HCL 25 MG/ML IJ SOLN: 6.2500 mg | INTRAMUSCULAR | Status: DC | PRN

## 2016-10-14 MED ORDER — IBUPROFEN 600 MG PO TABS: 200.0000 mg | ORAL_TABLET | Freq: Four times a day (QID) | ORAL | 0 refills | Status: AC | PRN

## 2016-10-14 MED ORDER — OXYCODONE HCL 5 MG PO TABS: 5.0000 mg | ORAL_TABLET | Freq: Once | ORAL | 0 refills | Status: AC | PRN

## 2016-10-14 MED ORDER — BUPIVACAINE HCL 0.25 % IJ SOLN
INTRAMUSCULAR | Status: DC | PRN
  Administered 2016-10-14: 20 mL

## 2016-10-14 MED ORDER — DEXAMETHASONE SODIUM PHOSPHATE 4 MG/ML IJ SOLN
INTRAMUSCULAR | Status: AC
  Filled 2016-10-14: qty 1

## 2016-10-14 MED ORDER — MIDAZOLAM HCL 2 MG/2ML IJ SOLN
INTRAMUSCULAR | Status: AC
  Filled 2016-10-14: qty 2

## 2016-10-14 MED ORDER — ONDANSETRON HCL 4 MG/2ML IJ SOLN
4.0000 mg | Freq: Once | INTRAMUSCULAR | Status: AC
  Administered 2016-10-14: 4 mg via INTRAVENOUS

## 2016-10-14 MED ORDER — PROMETHAZINE HCL 25 MG/ML IJ SOLN: 6.2500 mg | INTRAMUSCULAR | Status: DC | PRN

## 2016-10-14 MED ORDER — LORAZEPAM 2 MG/ML IJ SOLN
1.0000 mg | Freq: Once | INTRAMUSCULAR | Status: DC | PRN
Start: 2016-10-14 — End: 2016-10-14

## 2016-10-14 MED ORDER — SUCCINYLCHOLINE CHLORIDE 20 MG/ML IJ SOLN
INTRAMUSCULAR | Status: DC | PRN
  Administered 2016-10-14: 120 mg via INTRAVENOUS

## 2016-10-14 MED ORDER — LIDOCAINE HCL (CARDIAC) 20 MG/ML IV SOLN
INTRAVENOUS | Status: AC
  Filled 2016-10-14: qty 5

## 2016-10-14 MED ORDER — ONDANSETRON HCL 4 MG/2ML IJ SOLN
INTRAMUSCULAR | Status: AC
  Filled 2016-10-14: qty 2

## 2016-10-14 MED ORDER — DEXAMETHASONE SODIUM PHOSPHATE 4 MG/ML IJ SOLN
INTRAMUSCULAR | Status: DC | PRN
Start: 2016-10-14 — End: 2016-10-14
  Administered 2016-10-14: 4 mg via INTRAVENOUS

## 2016-10-14 MED ORDER — PROPOFOL 10 MG/ML IV BOLUS
INTRAVENOUS | Status: AC
  Filled 2016-10-14: qty 20

## 2016-10-14 MED ORDER — PHENYLEPHRINE 40 MCG/ML (10ML) SYRINGE FOR IV PUSH (FOR BLOOD PRESSURE SUPPORT)
PREFILLED_SYRINGE | INTRAVENOUS | Status: AC
  Filled 2016-10-14: qty 10

## 2016-10-14 MED ORDER — MIDAZOLAM HCL 2 MG/2ML IJ SOLN
INTRAMUSCULAR | Status: DC | PRN
  Administered 2016-10-14: 2 mg via INTRAVENOUS

## 2016-10-14 MED ORDER — METHYLERGONOVINE MALEATE 0.2 MG PO TABS: 0.2000 mg | ORAL_TABLET | Freq: Four times a day (QID) | ORAL | 0 refills | Status: AC

## 2016-10-14 MED ORDER — PROPOFOL 10 MG/ML IV BOLUS
INTRAVENOUS | Status: DC | PRN
  Administered 2016-10-14: 150 mg via INTRAVENOUS

## 2016-10-14 MED ORDER — LIDOCAINE HCL (CARDIAC) 20 MG/ML IV SOLN
INTRAVENOUS | Status: DC | PRN
  Administered 2016-10-14: 60 mg via INTRAVENOUS

## 2016-10-14 MED ORDER — FENTANYL CITRATE (PF) 100 MCG/2ML IJ SOLN
100.0000 ug | Freq: Once | INTRAMUSCULAR | Status: AC
  Administered 2016-10-14: 100 ug via INTRAVENOUS
  Filled 2016-10-14: qty 2

## 2016-10-14 MED ORDER — DOXYCYCLINE HYCLATE 100 MG IV SOLR
200.0000 mg | INTRAVENOUS | Status: AC
  Administered 2016-10-14: 200 mg via INTRAVENOUS
  Filled 2016-10-14: qty 200

## 2016-10-14 MED ORDER — OXYCODONE HCL 5 MG/5ML PO SOLN: 5.0000 mg | Freq: Once | ORAL | Status: DC | PRN

## 2016-10-14 MED ORDER — FENTANYL CITRATE (PF) 100 MCG/2ML IJ SOLN
INTRAMUSCULAR | Status: DC | PRN
  Administered 2016-10-14 (×2): 50 ug via INTRAVENOUS

## 2016-10-14 MED ORDER — MISOPROSTOL 200 MCG PO TABS
600.0000 ug | ORAL_TABLET | Freq: Once | ORAL | Status: AC
  Administered 2016-10-14: 600 ug via VAGINAL
  Filled 2016-10-14: qty 3

## 2016-10-14 MED ORDER — MISOPROSTOL 200 MCG PO TABS
ORAL_TABLET | ORAL | Status: AC
  Filled 2016-10-14: qty 2

## 2016-10-14 MED ORDER — SUCCINYLCHOLINE CHLORIDE 200 MG/10ML IV SOSY
PREFILLED_SYRINGE | INTRAVENOUS | Status: AC
  Filled 2016-10-14: qty 10

## 2016-10-14 MED ORDER — KETOROLAC TROMETHAMINE 30 MG/ML IJ SOLN
30.0000 mg | Freq: Once | INTRAMUSCULAR | Status: AC
  Administered 2016-10-14: 30 mg via INTRAVENOUS
  Filled 2016-10-14: qty 1

## 2016-10-14 MED ORDER — OXYCODONE HCL 5 MG PO TABS: 5.0000 mg | ORAL_TABLET | Freq: Once | ORAL | Status: DC | PRN

## 2016-10-14 MED ORDER — MISOPROSTOL 200 MCG PO TABS
600.0000 ug | ORAL_TABLET | Freq: Once | ORAL | Status: AC
  Administered 2016-10-14: 600 ug via BUCCAL
  Filled 2016-10-14: qty 3

## 2016-10-14 MED ORDER — ONDANSETRON 4 MG PO TBDP
4.0000 mg | ORAL_TABLET | Freq: Once | ORAL | Status: AC
  Administered 2016-10-14: 4 mg via ORAL

## 2016-10-14 MED ORDER — LACTATED RINGERS IV SOLN
INTRAVENOUS | Status: DC
  Administered 2016-10-14: 12:00:00 via INTRAVENOUS
  Administered 2016-10-14: 125 mL/h via INTRAVENOUS

## 2016-10-14 MED ORDER — FENTANYL CITRATE (PF) 250 MCG/5ML IJ SOLN
INTRAMUSCULAR | Status: AC
  Filled 2016-10-14: qty 5

## 2016-10-14 MED ORDER — PHENYLEPHRINE HCL 10 MG/ML IJ SOLN
INTRAMUSCULAR | Status: DC | PRN
  Administered 2016-10-14: 120 ug via INTRAVENOUS
  Administered 2016-10-14: 160 ug via INTRAVENOUS
  Administered 2016-10-14: 120 ug via INTRAVENOUS

## 2016-10-14 MED ORDER — ONDANSETRON HCL 4 MG/2ML IJ SOLN
INTRAMUSCULAR | Status: DC | PRN
  Administered 2016-10-14: 4 mg via INTRAVENOUS

## 2016-10-14 MED ORDER — PROMETHAZINE HCL 25 MG/ML IJ SOLN
25.0000 mg | Freq: Once | INTRAMUSCULAR | Status: AC
  Administered 2016-10-14: 25 mg via INTRAVENOUS
  Filled 2016-10-14: qty 1

## 2016-10-14 MED ORDER — LACTATED RINGERS IV SOLN: INTRAVENOUS | Status: DC

## 2016-10-14 MED ORDER — ONDANSETRON 4 MG PO TBDP
ORAL_TABLET | ORAL | Status: AC
  Filled 2016-10-14: qty 1

## 2016-10-14 MED ORDER — FENTANYL CITRATE (PF) 100 MCG/2ML IJ SOLN: 25.0000 ug | INTRAMUSCULAR | Status: DC | PRN

## 2016-10-14 MED ORDER — ROCURONIUM BROMIDE 100 MG/10ML IV SOLN
INTRAVENOUS | Status: AC
  Filled 2016-10-14: qty 1

## 2016-10-14 MED ORDER — MISOPROSTOL 200 MCG PO TABS
ORAL_TABLET | ORAL | Status: DC | PRN
  Administered 2016-10-14: 400 ug via RECTAL

## 2016-10-14 SURGICAL SUPPLY — 21 items
ADAPTER VACURETTE TBG SET 14 (CANNULA) ×2 IMPLANT
CATH ROBINSON RED A/P 16FR (CATHETERS) ×2 IMPLANT
CLOTH BEACON ORANGE TIMEOUT ST (SAFETY) ×2 IMPLANT
DECANTER SPIKE VIAL GLASS SM (MISCELLANEOUS) ×2 IMPLANT
GLOVE BIO SURGEON STRL SZ7.5 (GLOVE) ×2 IMPLANT
GLOVE BIOGEL PI IND STRL 7.0 (GLOVE) ×1 IMPLANT
GLOVE BIOGEL PI INDICATOR 7.0 (GLOVE) ×1
GOWN STRL REUS W/TWL LRG LVL3 (GOWN DISPOSABLE) ×2 IMPLANT
GOWN STRL REUS W/TWL XL LVL3 (GOWN DISPOSABLE) ×2 IMPLANT
KIT BERKELEY 1ST TRIMESTER 3/8 (MISCELLANEOUS) ×2 IMPLANT
NS IRRIG 1000ML POUR BTL (IV SOLUTION) ×2 IMPLANT
PACK VAGINAL MINOR WOMEN LF (CUSTOM PROCEDURE TRAY) ×2 IMPLANT
PAD OB MATERNITY 4.3X12.25 (PERSONAL CARE ITEMS) ×2 IMPLANT
PAD PREP 24X48 CUFFED NSTRL (MISCELLANEOUS) ×2 IMPLANT
SET BERKELEY SUCTION TUBING (SUCTIONS) ×2 IMPLANT
TOWEL OR 17X24 6PK STRL BLUE (TOWEL DISPOSABLE) ×4 IMPLANT
VACURETTE 10 RIGID CVD (CANNULA) IMPLANT
VACURETTE 14MM CVD 1/2 BASE (CANNULA) ×2 IMPLANT
VACURETTE 7MM CVD STRL WRAP (CANNULA) IMPLANT
VACURETTE 8 RIGID CVD (CANNULA) IMPLANT
VACURETTE 9 RIGID CVD (CANNULA) IMPLANT

## 2016-10-14 NOTE — Progress Notes (Signed)
Approximately 1000cc EBL with clots since admission to unit.  Placenta remains undelivered.  Patient dozing at intervals.  Refused chaplain to be notified.  Patient took pictures with her cell phone.  Refused family members to be called.  Did not want fetus to leave room.

## 2016-10-14 NOTE — ED Notes (Signed)
Pt continues to complain of diffuse abdominal pain. Pt demanding to be seen by MD. RN explained delay to pt. Pt continues to state "Something's not right with my baby! Check my baby." RN attempted to redirect pt to chair. Pt refusing to sit down. Pt continuing to demand to be seen.

## 2016-10-14 NOTE — ED Notes (Signed)
This RN, Dr Criss Alvine, PA, and rapid OB RN in room with pt. A small foot is observed coming out of the pt's vaginal area.

## 2016-10-14 NOTE — Progress Notes (Signed)
Report received from [facility] RN at [facility] ER.  Arminda Resides RN house coverage, for bed availability on women's unit and per [employer] PA ok with Dr Despina Hidden that pt be a direct admit.  Report given to [facility] and pt will go to rm [phone].

## 2016-10-14 NOTE — Progress Notes (Addendum)
Patient is found agitated and irritable. Pt states she needs to get out of bed and shower. RN explained why showering and getting out of bed is unsafe at this time. Pt appeared to calm down and settle in bed.

## 2016-10-14 NOTE — Op Note (Signed)
Palmina Hagood-Burgen PROCEDURE DATE: 10/14/2016  PREOPERATIVE DIAGNOSIS: approx 17 wk delivery with retained placenta POSTOPERATIVE DIAGNOSIS: The same PROCEDURE:     Dilation and Evacuation SURGEON:  Dr. Ernestina Penna                       Dr. Nettie Elm  INDICATIONS: 36 y.o. G3P0 with retained placenta at approx [redacted] weeks gestation.  Risks of surgery were discussed with the patient including but not limited to: bleeding which may require transfusion; infection which may require antibiotics; injury to uterus or surrounding organs; need for additional procedures including laparotomy or laparoscopy; possibility of intrauterine scarring which may impair future fertility; and other postoperative/anesthesia complications. Written informed consent was obtained.    FINDINGS:  A 17 week size uterus, moderate amounts of products of conception, specimen sent to pathology.  ANESTHESIA:    General, para cervical block with 0.25% bupivicaine ESTIMATED BLOOD LOSS:  Less than 50 ml. SPECIMENS:  Products of conception sent to pathology COMPLICATIONS:  None immediate.  PROCEDURE DETAILS:  The patient received intravenous Doxycycline while in the preoperative area.  She was then taken to the operating room where monitored intravenous sedation was administered and was found to be adequate.  After an adequate timeout was performed, she was placed in the dorsal lithotomy position and examined; then prepped and draped in the sterile manner.   Her bladder was catheterized for an unmeasured amount of clear, yellow urine. A vaginal speculum was then placed in the patient's vagina. The placenta and blood clot where noted in the cervical os. Ring forceps were used to remove parts of the placenta which was very friable. A 14mm currete was placed in the cervix and suction applied. A large amount of placental tissue was removed.  A sharp curettage was then performed to confirm complete emptying of the uterus. Ultrasound  was preformed which confirmed an empty uterus.There was minimal bleeding noted.  A paracervical block using 30 ml of 0.5% Marcaine was administered.  All instruments were removed from the patient's vagina.  Sponge and instrument counts were correct times two.  The patient tolerated the procedure well and was taken to the recovery area awake, and in stable condition. Cytotec placed rectally during the procedure.  The patient will be discharged to home as per PACU criteria.  Routine postoperative instructions given.  She was prescribed Percocet, Ibuprofen and methergine.  She will follow up in the clinic on 14 for postoperative evaluation.   Ernestina Penna, MD 10/14/16 12:29 PM

## 2016-10-14 NOTE — ED Provider Notes (Signed)
[facility]-EMERGENCY DEPT Provider Note   CSN: [id number] Arrival date & time: 10/14/16  4098  By signing my name below, I, Sheri Maldonado, attest that this documentation has been prepared under the direction and in the presence of Sheri Crumble, MD . Electronically Signed: Drue Maldonado Scribe. 10/14/2016. 4:05 AM  History   Chief Complaint Chief Complaint  Patient presents with  . Abdominal Pain  . Routine Prenatal Visit   HPI Sheri Maldonado is a [redacted] week pregnant 36 y.o. female with a history of hypertension presents to the Emergency Department complaining of severe, worsening diffuse cramping abdominal pain that radiates to her back and bilateral legs onset tonight PTA. She reports associated vaginal pain, clear vaginal discharge, nausea, and 6 episodes of emesis. No OTC medications taken PTA. No alleviating or exacerbating factors noted. She denies any vaginal bleeding.   The history is provided by the patient. No language interpreter was used.   Past Medical History:  Diagnosis Date  . Hypertension    There are no active problems to display for this patient.  History reviewed. No pertinent surgical history.  OB History    Gravida Para Term Preterm AB Living   1             SAB TAB Ectopic Multiple Live Births                 Home Medications    Prior to Admission medications   Medication Sig Start Date End Date Taking? Authorizing Provider  amoxicillin-clavulanate (AUGMENTIN) 875-125 MG tablet Take 1 tablet by mouth every 12 (twelve) hours. 01/21/16   Shaune Pollack, MD  hydrochlorothiazide (HYDRODIURIL) 25 MG tablet Take 1 tablet (25 mg total) by mouth daily. 08/28/15   Stevi Barrett, PA-C  naproxen (NAPROSYN) 375 MG tablet Take 1 tablet (375 mg total) by mouth 2 (two) times daily as needed. 01/21/16   Shaune Pollack, MD  predniSONE (DELTASONE) 10 MG tablet Take 4 tablets (40 mg total) by mouth daily. 11/05/12   Tatyana Kirichenko, PA-C  sodium chloride (OCEAN) 0.65 % SOLN  nasal spray Place 1 spray into both nostrils as needed for congestion. 01/21/16   Shaune Pollack, MD   Family History History reviewed. No pertinent family history.  Social History Social History  Substance Use Topics  . Smoking status: Current Some Day Smoker    Types: Cigarettes, Cigars  . Smokeless tobacco: Never Used  . Alcohol use Yes     Comment: OCCASSIONAL   Allergies   Sulfa antibiotics  Review of Systems Review of Systems  10 Systems reviewed and are negative for acute change except as noted in the HPI.  Physical Exam Updated Vital Signs BP (!) 145/101 (BP Location: Left Arm)   Pulse (!) 131   Temp 97.5 F (36.4 C) (Axillary)   Resp 16   LMP 01/19/2016 (Approximate)   SpO2 99%   Physical Exam  Constitutional: She is oriented to person, place, and time. She appears well-developed and well-nourished. She appears distressed.  HENT:  Head: Normocephalic and atraumatic.  Nose: Nose normal.  Mouth/Throat: Oropharynx is clear and moist. No oropharyngeal exudate.  Eyes: Conjunctivae and EOM are normal. Pupils are equal, round, and reactive to light. No scleral icterus.  Neck: Normal range of motion. Neck supple. No JVD present. No tracheal deviation present. No thyromegaly present.  Cardiovascular: Regular rhythm and normal heart sounds.  Exam reveals no gallop and no friction rub.   No murmur heard. tachycardic  Pulmonary/Chest: Effort normal and breath  sounds normal. No respiratory distress. She has no wheezes. She exhibits no tenderness.  Abdominal: Soft. Bowel sounds are normal. She exhibits no distension and no mass. There is no tenderness. There is no rebound and no guarding.  Genitourinary:  Genitourinary Comments: Appendage felt in the vaginal canal, could not assess cervix  Musculoskeletal: Normal range of motion. She exhibits no edema or tenderness.  Lymphadenopathy:    She has no cervical adenopathy.  Neurological: She is alert and oriented to person,  place, and time. No cranial nerve deficit. She exhibits normal muscle tone.  Skin: Skin is warm and dry. No rash noted. No erythema. No pallor.  Nursing note and vitals reviewed.   ED Treatments / Results  DIAGNOSTIC STUDIES:  Oxygen Saturation is 99% RA, normal by my interpretation.    COORDINATION OF CARE:  3:59 AM [facility] Discussed treatment plan with pt at bedside and pt agreed to plan.  Labs (all labs ordered are listed, but only abnormal results are displayed) Labs Reviewed  CBC - Abnormal; Notable for the following:       Result Value   WBC 11.4 (*)    Hemoglobin 11.7 (*)    HCT 34.0 (*)    All other components within normal limits  URINALYSIS, ROUTINE W REFLEX MICROSCOPIC - Abnormal; Notable for the following:    APPearance CLOUDY (*)    Protein, ur 30 (*)    Leukocytes, UA LARGE (*)    Bacteria, UA MANY (*)    Squamous Epithelial / LPF 6-30 (*)    All other components within normal limits  I-STAT BETA HCG BLOOD, ED ([facility], [facility], [facility] ONLY) - Abnormal; Notable for the following:    I-stat hCG, quantitative >2,000.0 (*)    All other components within normal limits  LIPASE, BLOOD  COMPREHENSIVE METABOLIC PANEL    EKG  EKG Interpretation None      Radiology No results found.  Procedures Procedures (including critical care time)  Medications Ordered in ED Medications  ondansetron (ZOFRAN-ODT) disintegrating tablet 4 mg (4 mg Oral Given 10/14/16 0249)   Initial Impression / Assessment and Plan / ED Course  I have reviewed the triage vital signs and the nursing notes.  Pertinent labs & imaging results that were available during my care of the patient were reviewed by me and considered in my medical decision making (see chart for details).     Patient presents to the ED for abdominal cramping and possible early labor.  PE reveals a foot out of the cervix.  Patient continued to have contractions in the ED and ultimately delivered the baby in  breech presentation. She was given IVF and zofran.  [facility]. Placenta still not delivered at the time of transport.     CRITICAL CARE Performed by: Sheri Maldonado   Total critical care time: 50 minutes - 17 week delivery  Critical care time was exclusive of separately billable procedures and treating other patients.  Critical care was necessary to treat or prevent imminent or life-threatening deterioration.  Critical care was time spent personally by me on the following activities: development of treatment plan with patient and/or surrogate as well as nursing, discussions with consultants, evaluation of patient's response to treatment, examination of patient, obtaining history from patient or surrogate, ordering and performing treatments and interventions, ordering and review of laboratory studies, ordering and review of radiographic studies, pulse oximetry and re-evaluation of patient's condition.    Final  Clinical Impressions(s) / ED Diagnoses   Final diagnoses:  None   New Prescriptions New Prescriptions   No medications on file   I personally performed the services described in this documentation, which was scribed in my presence. The recorded information has been reviewed and is accurate.       Sheri Crumble, MD 10/14/16 [phone]

## 2016-10-14 NOTE — Progress Notes (Signed)
Patient ID: Sheri Maldonado, female   DOB: 08-19-1980, 36 y.o.   MRN: [id number]  [facility] Attending  Pt now desires surgery for removal of placenta. R/B of procedure reviewed with pt. Pt verbalized understanding and agrees to proceed.  House coverage notified and GYN team is being called in for case  A/P DOD 17 weeks        Retained Placenta  Will proceed to D & E. Pt is NPO. Will placed SCD's. Antibiotics on call to OR.

## 2016-10-14 NOTE — Progress Notes (Signed)
Called to see patient regarding passage of large maount of blodd, currently no bleeding and no blood in vault Cervix unchanged with placenta still not showing any evidence of detaching s/p vaginal cytotec, concerned the bleeding may have caused premature evacuation of the cytotec, will give buccal dose  Pt is combative and dysphoric, has received IV phenergan for wretching and IV fentanyl 100 mcg  Dr Alysia Penna is aware of current pt status  Lazaro Arms, MD

## 2016-10-14 NOTE — Anesthesia Procedure Notes (Signed)
Procedure Name: Intubation Date/Time: 10/14/2016 12:01 PM Performed by: Raenette Rover Pre-anesthesia Checklist: Patient identified, Emergency Drugs available, Suction available and Patient being monitored Patient Re-evaluated:Patient Re-evaluated prior to inductionOxygen Delivery Method: Circle system utilized Preoxygenation: Pre-oxygenation with 100% oxygen Intubation Type: IV induction Ventilation: Mask ventilation without difficulty Laryngoscope Size: Mac and 3 Grade View: Grade II Tube type: Oral Tube size: 7.0 mm Number of attempts: 1 Airway Equipment and Method: Stylet Placement Confirmation: ETT inserted through vocal cords under direct vision,  positive ETCO2,  CO2 detector and breath sounds checked- equal and bilateral Secured at: 21 cm Tube secured with: Tape Dental Injury: Teeth and Oropharynx as per pre-operative assessment

## 2016-10-14 NOTE — H&P (Signed)
Sheri Maldonado is a 36 y.o. G3P0 with Patient's last menstrual period was 01/19/2016 (approximate). admitted for a as a transfer from [facility] ED s/p a 17 week pregnancy loss.   Note from ED MD: HPI Sheri Maldonado is a [redacted] week pregnant 36 y.o. female with a history of hypertension presents to the Emergency Department complaining of severe, worsening diffuse cramping abdominal pain that radiates to her back and bilateral legs onset tonight PTA. She reports associated vaginal pain, clear vaginal discharge, nausea, and 6 episodes of emesis. No OTC medications taken PTA. No alleviating or exacerbating factors noted. She denies any vaginal bleeding.  Subsequently just prior to transfer delivered a non viable 17 week fetus in the [facility] ED.  Placenta remains undelivered and intact, no current bleeding and the cervix is about 3 cm and the placenta is giving no indication of seperation  Admitted for management of retained placenta s/p very recent 17 week pregnancy loss      PMH:    Past Medical History:  Diagnosis Date  . Hypertension     PSH:    History reviewed. No pertinent surgical history.  POb/GynH:      OB History    Gravida Para Term Preterm AB Living   3         2   SAB TAB Ectopic Multiple Live Births                  SH:   Social History  Substance Use Topics  . Smoking status: Current Some Day Smoker    Types: Cigarettes, Cigars  . Smokeless tobacco: Never Used  . Alcohol use Yes     Comment: OCCASSIONAL    FH:   History reviewed. No pertinent family history.   Allergies:  Allergies  Allergen Reactions  . Sulfa Antibiotics Rash    Medications:       Current Facility-Administered Medications:  .  lactated ringers infusion, , Intravenous, Continuous, Lazaro Arms, MD, Last Rate: 125 mL/hr at 10/14/16 0515, 125 mL/hr at 10/14/16 0515 .  LORazepam (ATIVAN) injection 1 mg, 1 mg, Intravenous, Once PRN, Lazaro Arms, MD .  ondansetron  [facility]) 4 MG/2ML injection, , , ,   Review of Systems:   Review of Systems  Constitutional: Negative for fever, chills, weight loss, malaise/fatigue and diaphoresis.  HENT: Negative for hearing loss, ear pain, nosebleeds, congestion, sore throat, neck pain, tinnitus and ear discharge.   Eyes: Negative for blurred vision, double vision, photophobia, pain, discharge and redness.  Respiratory: Negative for cough, hemoptysis, sputum production, shortness of breath, wheezing and stridor.   Cardiovascular: Negative for chest pain, palpitations, orthopnea, claudication, leg swelling and PND.  Gastrointestinal: Positive for abdominal pain. Negative for heartburn, nausea, vomiting, diarrhea, constipation, blood in stool and melena.  Genitourinary: Negative for dysuria, urgency, frequency, hematuria and flank pain.  Musculoskeletal: Negative for myalgias, back pain, joint pain and falls.  Skin: Negative for itching and rash.  Neurological: Negative for dizziness, tingling, tremors, sensory change, speech change, focal weakness, seizures, loss of consciousness, weakness and headaches.  Endo/Heme/Allergies: Negative for environmental allergies and polydipsia. Does not bruise/bleed easily.  Psychiatric/Behavioral: Negative for depression, suicidal ideas, hallucinations, memory loss and substance abuse. The patient is not nervous/anxious and does not have insomnia.      PHYSICAL EXAM:  Blood pressure 124/85, pulse (!) 110, temperature 99.7 F (37.6 C), temperature source Tympanic, resp. rate 20, last menstrual period 01/19/2016, SpO2 99 %.  Vitals reviewed. Constitutional: She is oriented to person, place, and time. She appears well-developed and well-nourished.  HENT:  Head: Normocephalic and atraumatic.  Right Ear: External ear normal.  Left Ear: External ear normal.  Nose: Nose normal.  Mouth/Throat: Oropharynx is clear and moist.  Eyes: Conjunctivae and EOM are normal. Pupils are equal,  round, and reactive to light. Right eye exhibits no discharge. Left eye exhibits no discharge. No scleral icterus.  Neck: Normal range of motion. Neck supple. No tracheal deviation present. No thyromegaly present.  Cardiovascular: Normal rate, regular rhythm, normal heart sounds and intact distal pulses.  Exam reveals no gallop and no friction rub.   No murmur heard. Respiratory: Effort normal and breath sounds normal. No respiratory distress. She has no wheezes. She has no rales. She exhibits no tenderness.  GI: Soft. Bowel sounds are normal. She exhibits no distension and no mass. There is tenderness. There is no rebound and no guarding.  Genitourinary:       Vulva is normal without lesions Vagina is pink moist without discharge Cervix normal in appearance and pap is normal Uterus is enlarged, 12 weeks size Adnexa is negative with normal sized ovaries by sonogram  Musculoskeletal: Normal range of motion. She exhibits no edema and no tenderness.  Neurological: She is alert and oriented to person, place, and time. She has normal reflexes. She displays normal reflexes. No cranial nerve deficit. She exhibits normal muscle tone. Coordination normal.  Skin: Skin is warm and dry. No rash noted. No erythema. No pallor.  Psychiatric: She has a normal mood and affect. Her behavior is normal. Judgment and thought content normal.    Labs: Results for orders placed or performed during the hospital encounter of 10/14/16 (from the past 336 hour(s))  Urinalysis, Routine w reflex microscopic   Collection Time: 10/14/16  2:40 AM  Result Value Ref Range   Color, Urine YELLOW YELLOW   APPearance CLOUDY (A) CLEAR   Specific Gravity, Urine 1.019 1.005 - 1.030   pH 5.0 5.0 - 8.0   Glucose, UA NEGATIVE NEGATIVE mg/dL   Hgb urine dipstick NEGATIVE NEGATIVE   Bilirubin Urine NEGATIVE NEGATIVE   Ketones, ur NEGATIVE NEGATIVE mg/dL   Protein, ur 30 (A) NEGATIVE mg/dL   Nitrite NEGATIVE NEGATIVE   Leukocytes,  UA LARGE (A) NEGATIVE   RBC / HPF 6-30 0 - 5 RBC/hpf   WBC, UA TOO NUMEROUS TO COUNT 0 - 5 WBC/hpf   Bacteria, UA MANY (A) NONE SEEN   Squamous Epithelial / LPF 6-30 (A) NONE SEEN  Lipase, blood   Collection Time: 10/14/16  2:42 AM  Result Value Ref Range   Lipase 23 11 - 51 U/L  Comprehensive metabolic panel   Collection Time: 10/14/16  2:42 AM  Result Value Ref Range   Sodium 135 135 - 145 mmol/L   Potassium 3.1 (L) 3.5 - 5.1 mmol/L   Chloride 103 101 - 111 mmol/L   CO2 21 (L) 22 - 32 mmol/L   Glucose, Bld 109 (H) 65 - 99 mg/dL   BUN <5 (L) 6 - 20 mg/dL   Creatinine, Ser 1.61 0.44 - 1.00 mg/dL   Calcium 9.3 8.9 - 09.6 mg/dL   Total Protein 7.3 6.5 - 8.1 g/dL   Albumin 3.5 3.5 - 5.0 g/dL   AST 14 (L) 15 - 41 U/L   ALT 11 (L) 14 - 54 U/L   Alkaline Phosphatase 64 38 - 126 U/L   Total Bilirubin 0.1 (L) 0.3 -  1.2 mg/dL   GFR calc non Af Amer >60 >60 mL/min   GFR calc Af Amer >60 >60 mL/min   Anion gap 11 5 - 15  CBC   Collection Time: 10/14/16  2:42 AM  Result Value Ref Range   WBC 11.4 (H) 4.0 - 10.5 K/uL   RBC 4.35 3.87 - 5.11 MIL/uL   Hemoglobin 11.7 (L) 12.0 - 15.0 g/dL   HCT 40.9 (L) 81.1 - 91.4 %   MCV 78.2 78.0 - 100.0 fL   MCH 26.9 26.0 - 34.0 pg   MCHC 34.4 30.0 - 36.0 g/dL   RDW 78.2 95.6 - 21.3 %   Platelets 314 150 - 400 K/uL  I-Stat beta hCG blood, ED   Collection Time: 10/14/16  2:55 AM  Result Value Ref Range   I-stat hCG, quantitative >2,000.0 (H) <5 mIU/mL   Comment 3          Type and screen [facility]   Collection Time: 10/14/16  6:16 AM  Result Value Ref Range   ABO/RH(D) A POS    Antibody Screen POS    Sample Expiration 10/17/2016    Antibody Identification PENDING    DAT, IgG NEG     EKG: Orders placed or performed during the hospital encounter of 01/21/16  . ED EKG  . ED EKG  . EKG 12-Lead  . EKG 12-Lead  . EKG    Imaging Studies: No results found.    Assessment: s/p 17 week pregnancy loss, with retained  placenta admitted for management   Plan: cytotec per vagina Nausea control and pain control Type and screen NPO Follow bleeding for on going conservative management Pt understands surgical intervention may be necessary  EURE,LUTHER H 10/14/2016

## 2016-10-14 NOTE — Progress Notes (Addendum)
Patient found disconnected from IV and back to bed after showering. Pt reminded again of why showering and getting out of bed unassisted is unsafe. Pt instructed not to disconnect herself from her IV infusion. Patient instructed again not to get out of bed.

## 2016-10-14 NOTE — Anesthesia Preprocedure Evaluation (Addendum)
Anesthesia Evaluation  Patient identified by MRN, date of birth, ID band Patient awake    Reviewed: Allergy & Precautions, Patient's Chart, lab work & pertinent test results  Airway Mallampati: II  TM Distance: >3 FB Neck ROM: Full    Dental  (+) Teeth Intact, Dental Advisory Given   Pulmonary Current Smoker,    breath sounds clear to auscultation       Cardiovascular hypertension, Pt. on medications negative cardio ROS   Rhythm:Regular Rate:Normal     Neuro/Psych negative neurological ROS  negative psych ROS   GI/Hepatic negative GI ROS, Neg liver ROS,   Endo/Other  negative endocrine ROS  Renal/GU negative Renal ROS  negative genitourinary   Musculoskeletal negative musculoskeletal ROS (+)   Abdominal   Peds negative pediatric ROS (+)  Hematology negative hematology ROS (+)   Anesthesia Other Findings Day of surgery medications reviewed with the patient.  Reproductive/Obstetrics                            Lab Results  Component Value Date   WBC 11.4 (H) 10/14/2016   HGB 11.7 (L) 10/14/2016   HCT 34.0 (L) 10/14/2016   MCV 78.2 10/14/2016   PLT 314 10/14/2016   Lab Results  Component Value Date   CREATININE 0.68 10/14/2016   BUN <5 (L) 10/14/2016   NA 135 10/14/2016   K 3.1 (L) 10/14/2016   CL 103 10/14/2016   CO2 21 (L) 10/14/2016   No results found for: INR, PROTIME  01/2016 EKG: normal sinus rhythm.  Anesthesia Physical Anesthesia Plan  ASA: II  Anesthesia Plan: General   Post-op Pain Management:    Induction: Intravenous  Airway Management Planned:   Additional Equipment:   Intra-op Plan:   Post-operative Plan:   Informed Consent: I have reviewed the patients History and Physical, chart, labs and discussed the procedure including the risks, benefits and alternatives for the proposed anesthesia with the patient or authorized representative who has indicated  his/her understanding and acceptance.   Dental advisory given  Plan Discussed with: CRNA  Anesthesia Plan Comments:         Anesthesia Quick Evaluation

## 2016-10-14 NOTE — Discharge Summary (Signed)
    OB Discharge Summary     Patient Name: Sheri Maldonado DOB: May 29, 1981 MRN: [id number]  Date of admission: 10/14/2016  Date of discharge: 10/14/2016  Admitting diagnosis: [redacted] weeks pregnant  retained placenta Intrauterine pregnancy: [redacted]w[redacted]d     Secondary diagnosis:  Active Problems:   Abortion, incomplete  Additional problems: retained placenta     Discharge diagnosis: incomplete abortion                     Hospital course:  Patient had a miscairage of a 17 week infant in [place] ER with head entrapment. During transfer head dislodged and infant was delivered. [facility] Cord was cut and clamped. Patient was given cytotec. She has some bleeding noted by nursing, but none by physician. Placenta had not delivered and was not palpable near the os so she was consent for D&E and taken to the OR. Successful procedure was preformed. See separate operative note. Patient can be D/C home.   Physical exam  Vitals:   10/14/16 0546 10/14/16 0649 10/14/16 1034 10/14/16 1046  BP: 128/79 124/85 107/73 (!) 124/96  Pulse: (!) 120 (!) 110 (!) 116 (!) 116  Resp: Temp: 98.5 F (36.9 C) 99.7 F (37.6 C) 98.4 F (36.9 C) 98.7 F (37.1 C)  TempSrc: Oral Tympanic Oral Oral  SpO2: 97% 99% (!) 10% 100%   Labs: Lab Results  Component Value Date   WBC 11.4 (H) 10/14/2016   HGB 11.7 (L) 10/14/2016   HCT 34.0 (L) 10/14/2016   MCV 78.2 10/14/2016   PLT 314 10/14/2016   CMP Latest Ref Rng & Units 10/14/2016  Glucose 65 - 99 mg/dL 782(N)  BUN 6 - 20 mg/dL <5(A)  Creatinine 2.13 - 1.00 mg/dL 0.86  Sodium 578 - 469 mmol/L 135  Potassium 3.5 - 5.1 mmol/L 3.1(L)  Chloride 101 - 111 mmol/L 103  CO2 22 - 32 mmol/L 21(L)  Calcium 8.9 - 10.3 mg/dL 9.3  Total Protein 6.5 - 8.1 g/dL 7.3  Total Bilirubin 0.3 - 1.2 mg/dL 6.2(X)  Alkaline Phos 38 - 126 U/L 64  AST 15 - 41 U/L 14(L)  ALT 14 - 54 U/L 11(L)    Discharge instruction: per After Visit Summary and  "Baby and Me Booklet".  After visit meds:  Allergies as of 10/14/2016      Reactions   Sulfa Antibiotics Rash      Medication List    TAKE these medications   ibuprofen 600 MG tablet Commonly known as:  ADVIL,MOTRIN Take 0.5 tablets (300 mg total) by mouth every 6 (six) hours as needed.   methylergonovine 0.2 MG tablet Commonly known as:  METHERGINE Take 1 tablet (0.2 mg total) by mouth 4 (four) times daily.   oxyCODONE 5 MG immediate release tablet Commonly known as:  Oxy IR/ROXICODONE Take 1 tablet (5 mg total) by mouth once as needed (for pain score of 1-4).       Diet: routine diet  Activity: Advance as tolerated. Pelvic rest for 6 weeks.   Outpatient follow up:2 weeks Follow up Appt:No future appointments. Follow up Visit:No Follow-up on file.  Postpartum contraception: Not Discussed  10/14/2016 Ernestina Penna, MD

## 2016-10-14 NOTE — ED Notes (Signed)
Pt having frequent contraction and started delivering fetal legs showing, pt having difficulty to get fetal coming out, per Dr. Mora Bellman pt may need to be transfer to [facility] to help this process.

## 2016-10-14 NOTE — Anesthesia Postprocedure Evaluation (Addendum)
Anesthesia Post Note  Patient: Sheri Maldonado  Procedure(s) Performed: Procedure(s) (LRB): DILATATION AND EVACUATION, WITH ULTRA-SOUND (N/A)  Patient location during evaluation: PACU Anesthesia Type: General Level of consciousness: awake and alert Pain management: pain level controlled Vital Signs Assessment: post-procedure vital signs reviewed and stable Respiratory status: spontaneous breathing, nonlabored ventilation, respiratory function stable and patient connected to nasal cannula oxygen Cardiovascular status: blood pressure returned to baseline and stable Postop Assessment: no signs of nausea or vomiting Anesthetic complications: no Comments: Pt currently stable. She does not have any adults to care for her this evening but has two children at home alone. She would like to go home ASAP. We explained the risk and warning signs for medical attention. She displays understanding.         Last Vitals:  Vitals:   10/14/16 1300 10/14/16 1330  BP: 122/89 (!) 136/97  Pulse: (!) 116 (!) 113  Resp: 18 18  Temp:      Last Pain:  Vitals:   10/14/16 1300  TempSrc:   PainSc: Asleep   Pain Goal: Patients Stated Pain Goal: 2 (10/14/16 1046)               Shelton Silvas

## 2016-10-14 NOTE — Addendum Note (Signed)
Addendum  created 10/14/16 1456 by Yolonda Kida, CRNA   Charge Capture section accepted

## 2016-10-14 NOTE — ED Triage Notes (Signed)
Pt complaining of diffuse abdominal pain. Pt denies any vaginal bleeding or discharge. Pt states pain in bilateral legs and back as well. Pt also complaining of N/V, states 6 episodes of emesis tonight.

## 2016-10-14 NOTE — Progress Notes (Signed)
Patient in with abd pain and cramping, [redacted]weeks gestation.  Vaginal exam reveals parts in the vagina.  Dr. Despina Hidden made aware and patient to transfer to [facility].

## 2016-10-14 NOTE — ED Notes (Signed)
Pt moved to room. RN attached pt to monitor. Pt continues to demand to see MD. RN explained delay.

## 2016-10-14 NOTE — ED Notes (Signed)
Dr Mora Bellman in room with ultrasound

## 2016-10-14 NOTE — Transfer of Care (Signed)
Immediate Anesthesia Transfer of Care Note  Patient: Sheri Maldonado  Procedure(s) Performed: Procedure(s): DILATATION AND EVACUATION, WITH ULTRA-SOUND (N/A)  Patient Location: PACU  Anesthesia Type:General  Level of Consciousness: awake, alert , oriented, drowsy and patient cooperative  Airway & Oxygen Therapy: Patient Spontanous Breathing and Patient connected to nasal cannula oxygen  Post-op Assessment: Report given to RN and Post -op Vital signs reviewed and stable  Post vital signs: Reviewed and stable  Last Vitals:  Vitals:   10/14/16 1034 10/14/16 1046  BP: 107/73 (!) 124/96  Pulse: (!) 116 (!) 116  Resp: 20 20  Temp: 36.9 C 37.1 C    Last Pain:  Vitals:   10/14/16 1121  TempSrc:   PainSc: Asleep      Patients Stated Pain Goal: 2 (10/14/16 1046)  Complications: No apparent anesthesia complications

## 2016-10-14 NOTE — Discharge Instructions (Signed)
General Anesthesia, Adult, Care After These instructions provide you with information about caring for yourself after your procedure. Your health care provider may also give you more specific instructions. Your treatment has been planned according to current medical practices, but problems sometimes occur. Call your health care provider if you have any problems or questions after your procedure. What can I expect after the procedure? After the procedure, it is common to have:  Vomiting.  A sore throat.  Mental slowness. It is common to feel:  Nauseous.  Cold or shivery.  Sleepy.  Tired.  Sore or achy, even in parts of your body where you did not have surgery. Follow these instructions at home: For at least 24 hours after the procedure:   Do not:  Participate in activities where you could fall or become injured.  Drive.  Use heavy machinery.  Drink alcohol.  Take sleeping pills or medicines that cause drowsiness.  Make important decisions or sign legal documents.  Take care of children on your own.  Rest. Eating and drinking   If you vomit, drink water, juice, or soup when you can drink without vomiting.  Drink enough fluid to keep your urine clear or pale yellow.  Make sure you have little or no nausea before eating solid foods.  Follow the diet recommended by your health care provider. General instructions   Have a responsible adult stay with you until you are awake and alert.  Return to your normal activities as told by your health care provider. Ask your health care provider what activities are safe for you.  Take over-the-counter and prescription medicines only as told by your health care provider.  If you smoke, do not smoke without supervision.  Keep all follow-up visits as told by your health care provider. This is important. Contact a health care provider if:  You continue to have nausea or vomiting at home, and medicines are not helpful.  You  cannot drink fluids or start eating again.  You cannot urinate after 8-12 hours.  You develop a skin rash.  You have fever.  You have increasing redness at the site of your procedure. Get help right away if:  You have difficulty breathing.  You have chest pain.  You have unexpected bleeding.  You feel that you are having a life-threatening or urgent problem. This information is not intended to replace advice given to you by your health care provider. Make sure you discuss any questions you have with your health care provider. Document Released: 09/11/2000 Document Revised: 11/08/2015 Document Reviewed: 05/20/2015 Elsevier Interactive Patient Education  2017 [employer] Dilation and Curettage or Vacuum Curettage, Care After This sheet gives you information about how to care for yourself after your procedure. Your health care provider may also give you more specific instructions. If you have problems or questions, contact your health care provider. What can I expect after the procedure? After your procedure, it is common to have:  Mild pain or cramping.  Some vaginal bleeding or spotting. These may last for up to 2 weeks after your procedure. Follow these instructions at home: Activity    Do not drive or use heavy machinery while taking prescription pain medicine.  Avoid driving for the first 24 hours after your procedure.  Take frequent, short walks, followed by rest periods, throughout the day. Ask your health care provider what activities are safe for you. After 1-2 days, you may be able to return to your normal activities.  Do not lift  anything heavier than 10 lb (4.5 kg) until your health care provider approves.  For at least 2 weeks, or as long as told by your health care provider, do not:  Douche.  Use tampons.  Have sexual intercourse. General instructions    Take over-the-counter and prescription medicines only as told by your health care provider. This  is especially important if you take blood thinning medicine.  Do not take baths, swim, or use a hot tub until your health care provider approves. Take showers instead of baths.  Wear compression stockings as told by your health care provider. These stockings help to prevent blood clots and reduce swelling in your legs.  It is your responsibility to get the results of your procedure. Ask your health care provider, or the department performing the procedure, when your results will be ready.  Keep all follow-up visits as told by your health care provider. This is important. Contact a health care provider if:  You have severe cramps that get worse or that do not get better with medicine.  You have severe abdominal pain.  You cannot drink fluids without vomiting.  You develop pain in a different area of your pelvis.  You have bad-smelling vaginal discharge.  You have a rash. Get help right away if:  You have vaginal bleeding that soaks more than one sanitary pad in 1 hour, for 2 hours in a row.  You pass large blood clots from your vagina.  You have a fever that is above 100.29F (38.0C).  Your abdomen feels very tender or hard.  You have chest pain.  You have shortness of breath.  You cough up blood.  You feel dizzy or light-headed.  You faint.  You have pain in your neck or shoulder area. This information is not intended to replace advice given to you by your health care provider. Make sure you discuss any questions you have with your health care provider. Document Released: 06/02/2000 Document Revised: 02/02/2016 Document Reviewed: 01/06/2016 Elsevier Interactive Patient Education  2017 [employer].

## 2016-10-14 NOTE — OR Nursing (Signed)
In PACU pt reports to this RN that she does not have any responsible adult to stay with her at home. She also reports that her car is here and her plan is to drive home. Pt made aware that she cannot drive home. When asking pt more questions, she informs RN that she has a 15 and a 10 yr at home that are alone. Pt is adamant that she has to get home to her children even if she has to drive herself home. Several minutes spent with Dr. Alysia Penna and Dr. Hart Rochester about plan of care for pt. They both feel pt is better off at home with her children.  The 36 year old appears responsible and aware of situation.  The physicians also feel pt is ok to go home in an [place].  This was cleared with house coverage and received her approval as well.

## 2016-10-14 NOTE — ED Notes (Signed)
RN called to bedside by pt screaming. Pt with clear fluid running down legs. RN attempted to assess pt, pt continues to scream. Pt removing pants. RN encouraged pt to move to chair. Pt refusing. Pt continues to demand to be seen by MD. Charge RN notified.

## 2016-10-14 NOTE — ED Notes (Signed)
[facility] RN in room with Dr Mora Bellman to assess cervix.

## 2016-10-16 ENCOUNTER — Encounter (HOSPITAL_COMMUNITY): Payer: Self-pay | Admitting: Obstetrics and Gynecology

## 2016-10-17 ENCOUNTER — Encounter: Payer: Self-pay | Admitting: Obstetrics & Gynecology

## 2016-11-06 ENCOUNTER — Encounter: Payer: BLUE CROSS/BLUE SHIELD | Admitting: Obstetrics & Gynecology

## 2016-11-17 NOTE — Addendum Note (Signed)
Addendum  created 11/17/16 1221 by Shelton SilvasHollis, Kadeidra Coryell D, MD   Sign clinical note

## 2016-12-29 IMAGING — CT CT HEAD W/O CM
4 series · 17 of 47 positions shown, 19 images · non-contrast
Comparison: None

CLINICAL DATA: Acute onset headache

EXAM:
CT HEAD WITHOUT CONTRAST
TECHNIQUE: Contiguous axial images were obtained from the base of the skull
through the vertex without intravenous contrast.

[Series 201: head w/o, idose (1) · axial · non-contrast · 0.49mm/px · z∈[+62,+182]mm · 8 of 32 slices shown, 10 images]
[im 4/32  brain]
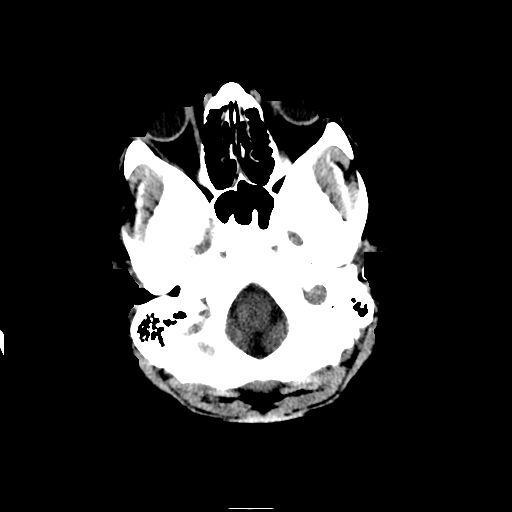
[im 4/32  bone]
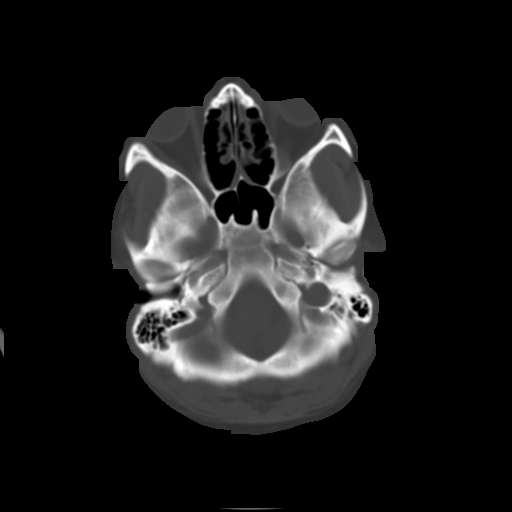
[im 7/32  brain]
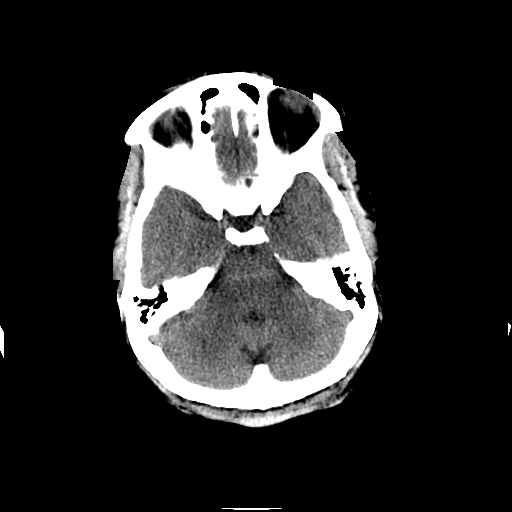
[im 11/32  brain]
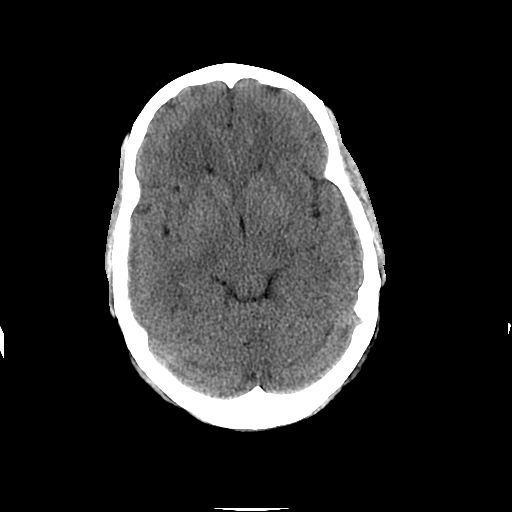
[im 14/32  brain]
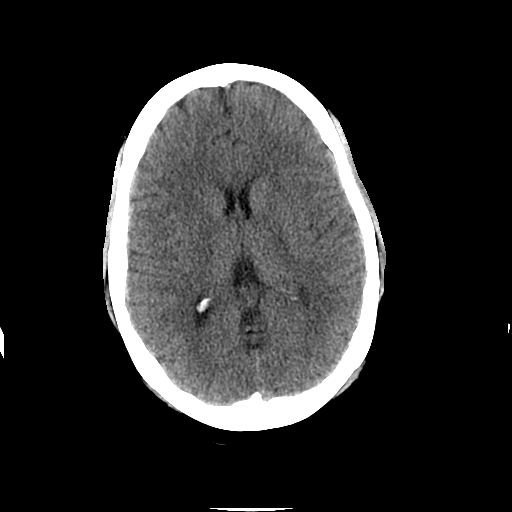
[im 18/32  brain]
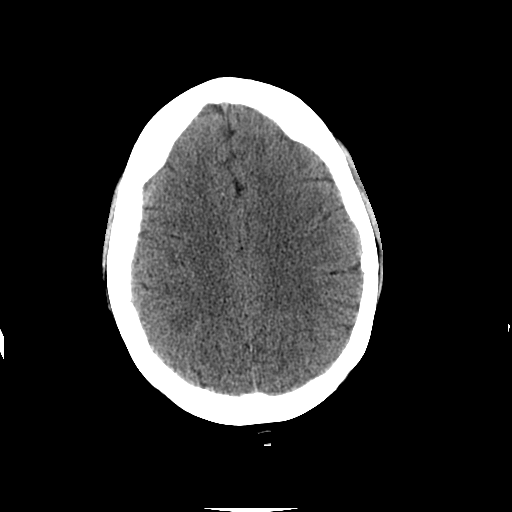
[im 18/32  bone]
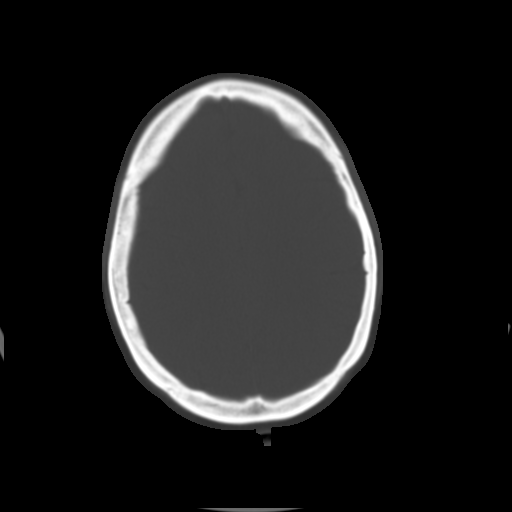
[im 21/32  brain]
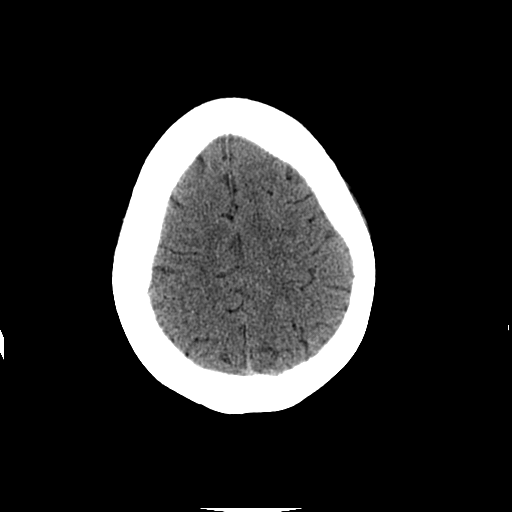
[im 25/32  brain]
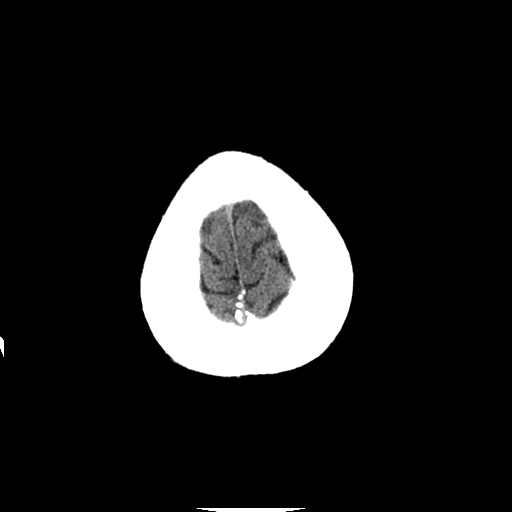
[im 28/32  brain]
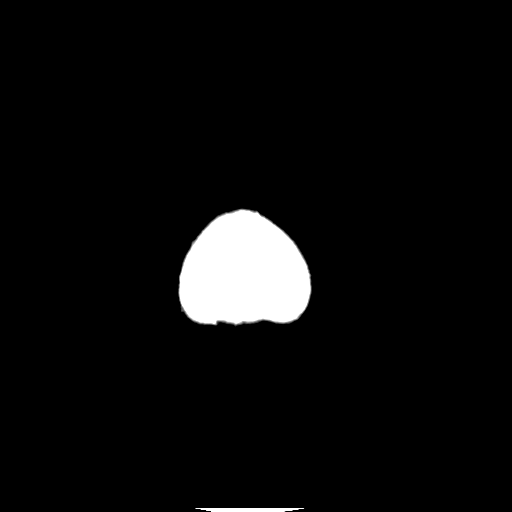

[Series 202: head w/o bone, idose (1) · axial · non-contrast · 0.49mm/px · z∈[+60,+93]mm · 3 of 64 slices shown]
[im 7/64  bone]
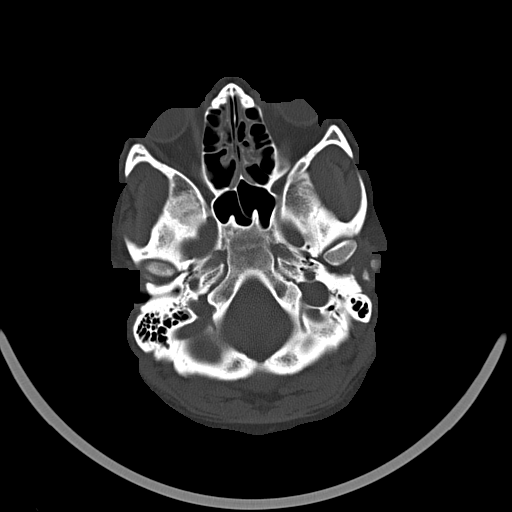
[im 14/64  bone]
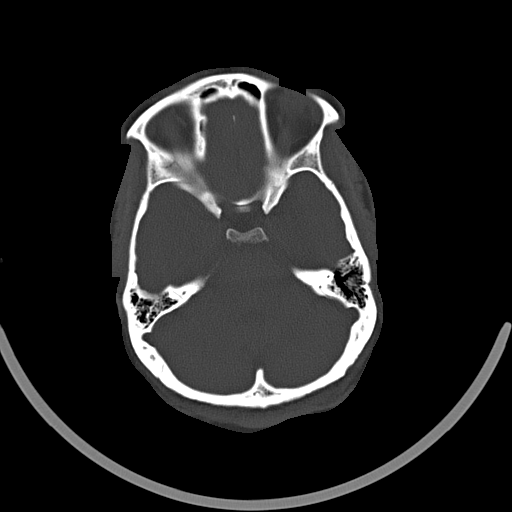
[im 20/64  bone]
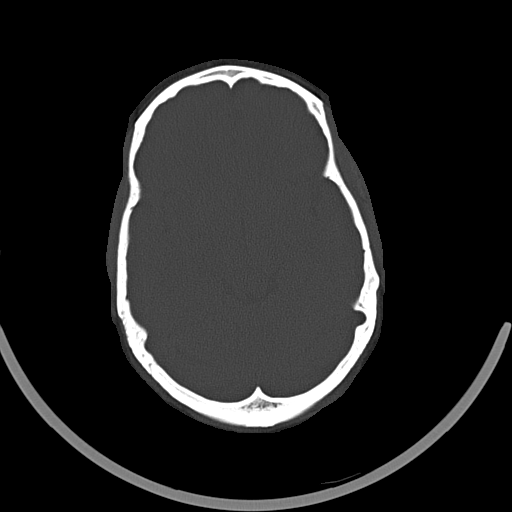

[Series 203: coronal st, idose (1) · coronal · 0.40mm/px · 3 of 75 slices shown]
[im 25/75  brain]
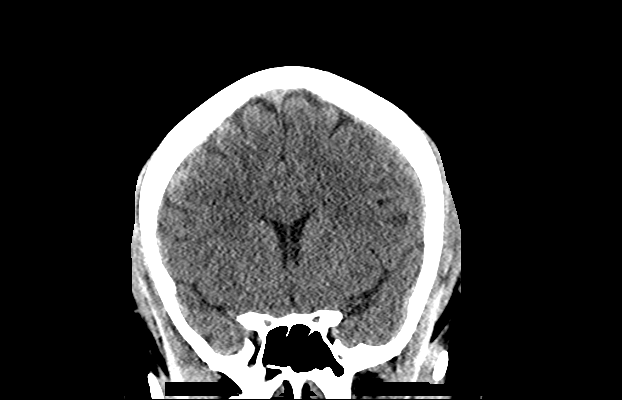
[im 33/75  brain]
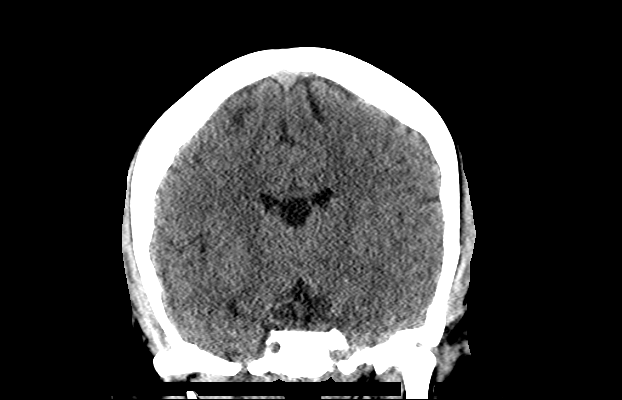
[im 42/75  brain]
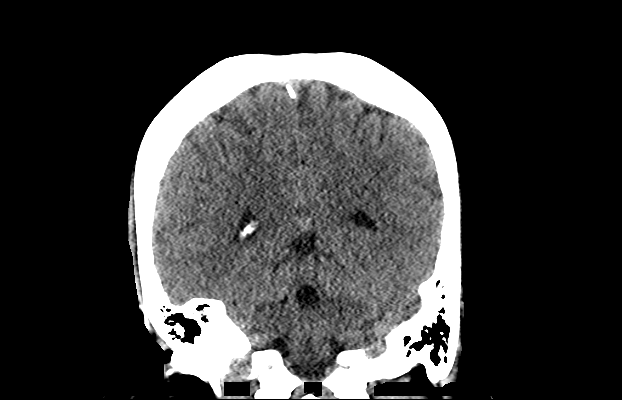

[Series 204: sagittal st, idose (1) · sagittal · 0.40mm/px · 3 of 83 slices shown]
[im 28/83  brain]
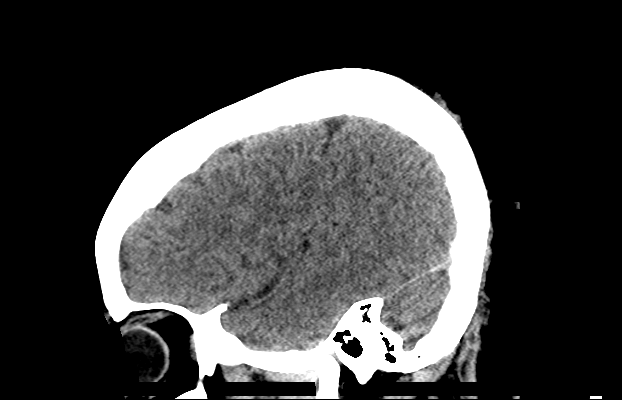
[im 42/83  brain]
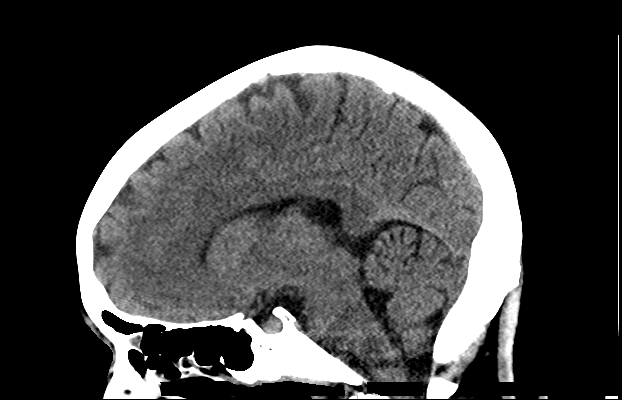
[im 55/83  brain]
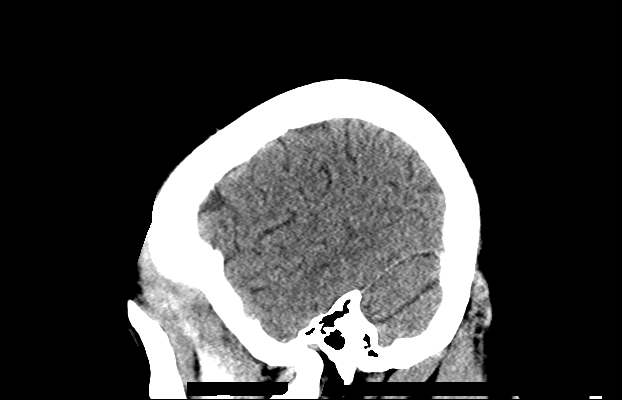

[17 of 47 positions shown; findings below may reference images not displayed]

FINDINGS: Brain: The ventricles are normal in size and configuration. There is
a prominent cavum vergae, an anatomic variant. There is no
intracranial mass, hemorrhage, extra-axial fluid collection, or
midline shift. Gray-white compartments are normal. No acute infarct
evident.

Vascular: No hyperdense vessel. There are no demonstrable vascular
calcifications.

Skull: The bony calvarium appears intact.

Sinuses/Orbits: The orbits appear symmetric bilaterally. There is
opacification of multiple ethmoid air cells. There is mucosal
thickening in both superior maxillary antra. Frontal sinuses are
nearly aplastic.

Other: Mastoid air cells are clear.
IMPRESSION: Areas of paranasal sinus disease. No intracranial mass, hemorrhage,
or focal gray - white compartment lesions/acute appearing infarct.
